# Patient Record
Sex: Male | Born: 1955 | Race: White | Hispanic: No | Marital: Married | Smoking: Never smoker
Health system: Southern US, Community
[De-identification: ages and names within clinical notes are randomized; demographics above are authoritative.]

## PROBLEM LIST (undated history)

## (undated) DIAGNOSIS — I2510 Atherosclerotic heart disease of native coronary artery without angina pectoris: Secondary | ICD-10-CM

## (undated) DIAGNOSIS — I10 Essential (primary) hypertension: Secondary | ICD-10-CM

## (undated) DIAGNOSIS — I4891 Unspecified atrial fibrillation: Secondary | ICD-10-CM

## (undated) HISTORY — PX: FETAL SURGERY FOR CONGENITAL HERNIA: SHX1618

## (undated) HISTORY — DX: Essential (primary) hypertension: I10

## (undated) HISTORY — DX: Atherosclerotic heart disease of native coronary artery without angina pectoris: I25.10

---

## 2008-10-03 ENCOUNTER — Emergency Department (HOSPITAL_COMMUNITY): Admission: EM | Admit: 2008-10-03 | Discharge: 2008-10-03 | Payer: Self-pay | Admitting: Emergency Medicine

## 2010-10-11 ENCOUNTER — Other Ambulatory Visit: Payer: Self-pay | Admitting: Family Medicine

## 2010-10-11 DIAGNOSIS — H539 Unspecified visual disturbance: Secondary | ICD-10-CM

## 2010-12-02 ENCOUNTER — Other Ambulatory Visit: Payer: Self-pay

## 2014-12-23 ENCOUNTER — Other Ambulatory Visit: Payer: Self-pay | Admitting: Family Medicine

## 2014-12-23 DIAGNOSIS — M79601 Pain in right arm: Secondary | ICD-10-CM

## 2015-01-02 ENCOUNTER — Ambulatory Visit
Admission: RE | Admit: 2015-01-02 | Discharge: 2015-01-02 | Disposition: A | Discharge status: Home / Self Care | Payer: BC Managed Care – PPO | Source: Ambulatory Visit | Attending: Family Medicine | Admitting: Family Medicine

## 2015-01-02 DIAGNOSIS — M79601 Pain in right arm: Secondary | ICD-10-CM

## 2016-03-02 ENCOUNTER — Other Ambulatory Visit: Payer: Self-pay | Admitting: Family Medicine

## 2016-03-02 ENCOUNTER — Ambulatory Visit
Admission: RE | Admit: 2016-03-02 | Discharge: 2016-03-02 | Disposition: A | Discharge status: Home / Self Care | Payer: BC Managed Care – PPO | Source: Ambulatory Visit | Attending: Family Medicine | Admitting: Family Medicine

## 2016-03-02 DIAGNOSIS — R109 Unspecified abdominal pain: Secondary | ICD-10-CM

## 2021-07-05 DIAGNOSIS — Z8249 Family history of ischemic heart disease and other diseases of the circulatory system: Secondary | ICD-10-CM | POA: Diagnosis not present

## 2021-07-05 DIAGNOSIS — M1990 Unspecified osteoarthritis, unspecified site: Secondary | ICD-10-CM | POA: Diagnosis not present

## 2021-07-05 DIAGNOSIS — I10 Essential (primary) hypertension: Secondary | ICD-10-CM | POA: Diagnosis not present

## 2021-07-05 DIAGNOSIS — E785 Hyperlipidemia, unspecified: Secondary | ICD-10-CM | POA: Diagnosis not present

## 2021-08-15 DIAGNOSIS — G629 Polyneuropathy, unspecified: Secondary | ICD-10-CM | POA: Diagnosis not present

## 2021-08-15 DIAGNOSIS — I10 Essential (primary) hypertension: Secondary | ICD-10-CM | POA: Diagnosis not present

## 2021-08-15 DIAGNOSIS — R42 Dizziness and giddiness: Secondary | ICD-10-CM | POA: Diagnosis not present

## 2021-09-06 DIAGNOSIS — N23 Unspecified renal colic: Secondary | ICD-10-CM | POA: Diagnosis not present

## 2021-09-11 NOTE — Progress Notes (Signed)
?Cardiology Office Note:   ?Date:  09/13/2021  ?NAME:  Allah Hockenbury    ?MRN: [id number] ?DOB:  1956-02-18  ? ?PCP:  Pcp, No  ?Cardiologist:  None  ?Electrophysiologist:  None  ? ?Referring MD: Sigmund Hazel, MD  ? ?Chief Complaint  ?Patient presents with  ? Hyperlipidemia  ? Dizziness  ? ? ?History of Present Illness:   ?Johnathin Wichmann is a 66 y.o. male with a hx of HLD, HTN who is being seen today for the evaluation of HLD at the request of Sigmund Hazel, MD. He reports he had an episode of dizziness and low blood pressure. He reports he went for a walk with his wife around 11 am that morning. BP was checked and around 90/60. Had no CP or SOB. Reports low energy with the episode. Symptoms resolved and no further symptoms despite some fatigue with carrying his grandchild. Walks 20 miles per week. No CP or SOB. EKG with nsr and no acute changes. CV exam normal. Dad had heart disease which appears to be valve related. No coronary history in family. On a statin and LDL well controlled. Not diabetic. BP has been controlled. He is concerned he had a heart attack. Apparently did not eat breakfast the day in question. Only had 1 glass of water. I suspect this was a vagal event in the setting of dehydration. Recent labs show normal HGB 16.4 and normal TSH.   ? ?Problem List ?HTN ?HLD ?-T chol 98, HDL 32, LDL 53, TG 61 ? ?Past Medical History: ?Past Medical History:  ?Diagnosis Date  ? Hypertension   ? ? ?Past Surgical History: ?Past Surgical History:  ?Procedure Laterality Date  ? FETAL SURGERY FOR CONGENITAL HERNIA    ? ? ?Current Medications: ?Current Meds  ?Medication Sig  ? atorvastatin (LIPITOR) 10 MG tablet 1 tablet  ? B Complex CAPS 1 capsule  ? doxylamine, Sleep, (SLEEP AID) 25 MG tablet 1 tablet at bedtime as needed  ? losartan (COZAAR) 100 MG tablet 1 tablet  ? naproxen sodium (ALEVE) 220 MG tablet 1 tablet with food or milk as needed  ?  ? ?Allergies:    ?Patient has no allergy information on record.  ? ?Social  History: ?Social History  ? ?Socioeconomic History  ? Marital status: Married  ?  Spouse name: Not on file  ? Number of children: 2  ? Years of education: Not on file  ? Highest education level: Not on file  ?Occupational History  ? Occupation: Print production planner  ?Tobacco Use  ? Smoking status: Never  ? Smokeless tobacco: Never  ?Substance and Sexual Activity  ? Alcohol use: Not Currently  ? Drug use: Never  ? Sexual activity: Not on file  ?Other Topics Concern  ? Not on file  ?Social History Narrative  ? Not on file  ? ?Social Determinants of Health  ? ?Financial Resource Strain: Not on file  ?Food Insecurity: Not on file  ?Transportation Needs: Not on file  ?Physical Activity: Not on file  ?Stress: Not on file  ?Social Connections: Not on file  ?  ? ?Family History: ?The patient's family history includes Heart disease in his father. ? ?ROS:   ?All other ROS reviewed and negative. Pertinent positives noted in the HPI.    ? ?EKGs/Labs/Other Studies Reviewed:   ?The following studies were personally reviewed by me today: ? ?EKG:  EKG is ordered today.  The ekg ordered today demonstrates normal sinus rhythm heart rate 61, no acute ischemic changes  or evidence of infarction, and was personally reviewed by me.  ? ?Recent Labs: ?No results found for requested labs within last 8760 hours.  ? ?Recent Lipid Panel ?No results found for: CHOL, TRIG, HDL, CHOLHDL, VLDL, LDLCALC, LDLDIRECT ? ?Physical Exam:   ?VS:  BP 124/78   Pulse 61   Ht 6' (1.829 m)   Wt 212 lb 12.8 oz (96.5 kg)   SpO2 97%   BMI 28.86 kg/m?    ?Wt Readings from Last 3 Encounters:  ?09/13/21 212 lb 12.8 oz (96.5 kg)  ?  ?General: Well nourished, well developed, in no acute distress ?Head: Atraumatic, normal size  ?Eyes: PEERLA, EOMI  ?Neck: Supple, no JVD ?Endocrine: No thryomegaly ?Cardiac: Normal S1, S2; RRR; no murmurs, rubs, or gallops ?Lungs: Clear to auscultation bilaterally, no wheezing, rhonchi or rales  ?Abd: Soft, nontender, no hepatomegaly   ?Ext: No edema, pulses 2+ ?Musculoskeletal: No deformities, BUE and BLE strength normal and equal ?Skin: Warm and dry, no rashes   ?Neuro: Alert and oriented to person, place, time, and situation, CNII-XII grossly intact, no focal deficits  ?Psych: Normal mood and affect  ? ?ASSESSMENT:   ?Merton Borderrthur Schley is a 66 y.o. male who presents for the following: ?1. Mixed hyperlipidemia   ?2. Dizziness   ? ? ?PLAN:   ?1. Mixed hyperlipidemia ?2. Dizziness ?-had a vasovagal episode on 08/06/2021. No further recurrence of symptoms. Recent labs including hgb normal. EKG normal. CV exam normal. Walking 20 miles/week without CP or SOB. I really think this is non-cardiac. I have recommended calcium scoring for further risk stratification. If CAC=0, low risk and no further testing. I really have low suspicion for heart disease. LDL controlled and HTN controlled. We will see back as needed. Recommended to stay hydrated and continue with exercise.  ? ?Disposition: Return if symptoms worsen or fail to improve. ? ?Medication Adjustments/Labs and Tests Ordered: ?Current medicines are reviewed at length with the patient today.  Concerns regarding medicines are outlined above.  ?Orders Placed This Encounter  ?Procedures  ? CT CARDIAC SCORING (SELF PAY ONLY)  ? EKG 12-Lead  ? ?No orders of the defined types were placed in this encounter. ? ? ?Patient Instructions  ?Medication Instructions:  ?The current medical regimen is effective;  continue present plan and medications. ? ?*If you need a refill on your cardiac medications before your next appointment, please call your pharmacy* ? ? ? ?Testing/Procedures: ?CALCIUM SCORE ? ? ?Follow-Up: ?At [facility], you and your health needs are our priority.  As part of our continuing mission to provide you with exceptional heart care, we have created designated Provider Care Teams.  These Care Teams include your primary Cardiologist (physician) and Advanced Practice Providers (APPs -  Physician  Assistants and Nurse Practitioners) who all work together to provide you with the care you need, when you need it. ? ?We recommend signing up for the patient portal called "MyChart".  Sign up information is provided on this After Visit Summary.  MyChart is used to connect with patients for Virtual Visits (Telemedicine).  Patients are able to view lab/test results, encounter notes, upcoming appointments, etc.  Non-urgent messages can be sent to your provider as well.   ?To learn more about what you can do with MyChart, go to [email or web address].   ? ?Your next appointment:   ?As needed ? ?The format for your next appointment:   ?In Person ? ?Provider:   ?Lennie OdorWesley O'Neal, MD  ?  ? ?Signed, ?Gerri SporeWesley T.  Flora Lipps, MD, [facility] ?[place]  [facility]  ?[address], [address] ?[place], [place] [address] ?[phone]  ?09/13/2021 12:31 PM    ? ?

## 2021-09-13 ENCOUNTER — Ambulatory Visit (INDEPENDENT_AMBULATORY_CARE_PROVIDER_SITE_OTHER): Payer: Medicare PPO | Admitting: Cardiovascular Disease

## 2021-09-13 ENCOUNTER — Encounter: Payer: Self-pay | Admitting: Cardiovascular Disease

## 2021-09-13 ENCOUNTER — Other Ambulatory Visit: Payer: Self-pay

## 2021-09-13 VITALS — BP 124/78 | HR 61 | Ht 72.0 in | Wt 212.8 lb

## 2021-09-13 DIAGNOSIS — R42 Dizziness and giddiness: Secondary | ICD-10-CM

## 2021-09-13 DIAGNOSIS — E782 Mixed hyperlipidemia: Secondary | ICD-10-CM | POA: Diagnosis not present

## 2021-09-13 NOTE — Patient Instructions (Signed)
Medication Instructions:  The current medical regimen is effective;  continue present plan and medications.  *If you need a refill on your cardiac medications before your next appointment, please call your pharmacy*   Testing/Procedures: CALCIUM SCORE   Follow-Up: At [facility], you and your health needs are our priority.  As part of our continuing mission to provide you with exceptional heart care, we have created designated Provider Care Teams.  These Care Teams include your primary Cardiologist (physician) and Advanced Practice Providers (APPs -  Physician Assistants and Nurse Practitioners) who all work together to provide you with the care you need, when you need it.  We recommend signing up for the patient portal called "MyChart".  Sign up information is provided on this After Visit Summary.  MyChart is used to connect with patients for Virtual Visits (Telemedicine).  Patients are able to view lab/test results, encounter notes, upcoming appointments, etc.  Non-urgent messages can be sent to your provider as well.   To learn more about what you can do with MyChart, go to [email or web address].    Your next appointment:   As needed  The format for your next appointment:   In Person  Provider:   [place] O'Neal, MD     

## 2021-10-26 ENCOUNTER — Ambulatory Visit (INDEPENDENT_AMBULATORY_CARE_PROVIDER_SITE_OTHER)
Admission: RE | Admit: 2021-10-26 | Discharge: 2021-10-26 | Disposition: A | Discharge status: Home / Self Care | Payer: Self-pay | Source: Ambulatory Visit | Attending: Cardiovascular Disease | Admitting: Cardiovascular Disease

## 2021-10-26 ENCOUNTER — Encounter: Payer: Self-pay | Admitting: Cardiovascular Disease

## 2021-10-26 DIAGNOSIS — E782 Mixed hyperlipidemia: Secondary | ICD-10-CM

## 2021-11-10 NOTE — Progress Notes (Signed)
Cardiology Office Note:   Date:  11/11/2021  NAME:  Jamy Alkhatib    MRN: [id number] DOB:  1956-04-10   PCP:  Sigmund Hazel, MD  Cardiologist:  None  Electrophysiologist:  None   Referring MD: Sigmund Hazel, MD   Chief Complaint  Patient presents with   Follow-up        History of Present Illness:   Dillyn Douthat is a 66 y.o. male with a hx of HTN, HLD, elevated calcium score who presents for follow-up.  No chest pain symptoms.  Here to discuss calcium scoring as well as a sending aortic aneurysm.  Reports no chest pain or trouble breathing.  Can walk 3 to 5 miles 4 to 5 days/week.  Calcium score in the 91st percentile.  Value 992.  We discussed proper diet as well as exercise.  Seems to be doing this well.  His most recent LDL cholesterol is 53 which is at goal.  We discussed proceeding with coronary CTA to exclude high risk disease.  He is okay to do this.  He reports some increased shortness of breath with heavy activity.  We also discussed LP(a).  Really no risk factors for coronary disease.  Would like to make sure he does not have anything that explains this.  He also needs to have a contrast CT which we can do with the coronary CT to look at his ascending aorta.  He is willing to do both.  Problem List HTN HLD -T chol 98, HDL 32, LDL 53, TG 61 3. CAD -calcium score 992 (91st percentile) 4. Ascending aortic aneurysm 43 mm  Past Medical History: Past Medical History:  Diagnosis Date   Hypertension     Past Surgical History: Past Surgical History:  Procedure Laterality Date   FETAL SURGERY FOR CONGENITAL HERNIA      Current Medications: Current Meds  Medication Sig   atorvastatin (LIPITOR) 10 MG tablet 1 tablet   B Complex CAPS 1 capsule   doxylamine, Sleep, (SLEEP AID) 25 MG tablet 1 tablet at bedtime as needed   losartan (COZAAR) 100 MG tablet 1 tablet   LOW-DOSE ASPIRIN PO    metoprolol tartrate (LOPRESSOR) 100 MG tablet Take 1 tablet by mouth once for procedure.    naproxen sodium (ALEVE) 220 MG tablet 1 tablet with food or milk as needed     Allergies:    Patient has no allergy information on record.   Social History: Social History   Socioeconomic History   Marital status: Married    Spouse name: Not on file   Number of children: 2   Years of education: Not on file   Highest education level: Not on file  Occupational History   Occupation: Business analysis  Tobacco Use   Smoking status: Never   Smokeless tobacco: Never  Substance and Sexual Activity   Alcohol use: Not Currently   Drug use: Never   Sexual activity: Not on file  Other Topics Concern   Not on file  Social History Narrative   Not on file   Social Determinants of Health   Financial Resource Strain: Not on file  Food Insecurity: Not on file  Transportation Needs: Not on file  Physical Activity: Not on file  Stress: Not on file  Social Connections: Not on file     Family History: The patient's family history includes Heart disease in his father.  ROS:   All other ROS reviewed and negative. Pertinent positives noted in the HPI.  EKGs/Labs/Other Studies Reviewed:   The following studies were personally reviewed by me today:  CAC 10/26/2021 IMPRESSION: Coronary calcium score of 992 Agatston units. This was 91st percentile for age-, race-, and sex-matched controls.   Mildly dilated ascending aorta, 4.3 cm.  Recent Labs: No results found for requested labs within last 8760 hours.   Recent Lipid Panel No results found for: CHOL, TRIG, HDL, CHOLHDL, VLDL, LDLCALC, LDLDIRECT  Physical Exam:   VS:  BP 136/80   Pulse 96   Ht 6' (1.829 m)   Wt 210 lb 12.8 oz (95.6 kg)   SpO2 96%   BMI 28.59 kg/m    Wt Readings from Last 3 Encounters:  11/11/21 210 lb 12.8 oz (95.6 kg)  09/13/21 212 lb 12.8 oz (96.5 kg)    General: Well nourished, well developed, in no acute distress Head: Atraumatic, normal size  Eyes: PEERLA, EOMI  Neck: Supple, no JVD Endocrine:  No thryomegaly Cardiac: Normal S1, S2; RRR; no murmurs, rubs, or gallops Lungs: Clear to auscultation bilaterally, no wheezing, rhonchi or rales  Abd: Soft, nontender, no hepatomegaly  Ext: No edema, pulses 2+ Musculoskeletal: No deformities, BUE and BLE strength normal and equal Skin: Warm and dry, no rashes   Neuro: Alert and oriented to person, place, time, and situation, CNII-XII grossly intact, no focal deficits  Psych: Normal mood and affect   ASSESSMENT:   Verdie Marland is a 66 y.o. male who presents for the following: 1. Coronary artery disease involving native coronary artery of native heart without angina pectoris   2. SOB (shortness of breath) on exertion   3. Mixed hyperlipidemia   4. Aneurysm of ascending aorta without rupture (HCC)   5. Primary hypertension     PLAN:   1. Coronary artery disease involving native coronary artery of native heart without angina pectoris -Coronary calcium score 992 which is in the 91st percentile.  No chest pain.  EKG normal.  He does report exertional shortness of breath.  This is concerning.  I would like for him to proceed with coronary CTA.  He will take metoprolol tartrate 100 mg 2 hours before the scan.  We will also check a BMP as well as LP(a).  Would like to make sure he has no genetic predisposition to coronary disease.  This will also have implications for family.  He also needs an echocardiogram.  No murmurs on examination.  2. SOB (shortness of breath) on exertion -Exertional shortness of breath.  Occurs with heavy activity.  Would like for him to proceed with coronary CTA as above.  Also needs an echo.  3. Mixed hyperlipidemia -On aspirin 81 mg daily.  On Lipitor.  Most recent LDL 53.  This is at goal.  4. Aneurysm of ascending aorta without rupture (HCC) -43 mm on noncontrast CT.  Contrast CT as above.  Needs echo to look at aortic valve.  5. Primary hypertension -Well-controlled.  No change medication.      Disposition:  Return in about 6 months (around 05/14/2022).  Medication Adjustments/Labs and Tests Ordered: Current medicines are reviewed at length with the patient today.  Concerns regarding medicines are outlined above.  Orders Placed This Encounter  Procedures   CT CORONARY MORPH W/CTA COR W/SCORE W/CA W/CM &/OR WO/CM   Basic metabolic panel   Lipoprotein A (LPA)   ECHOCARDIOGRAM COMPLETE   Meds ordered this encounter  Medications   metoprolol tartrate (LOPRESSOR) 100 MG tablet    Sig: Take 1 tablet by mouth  once for procedure.    Dispense:  1 tablet    Refill:  0    Patient Instructions  Medication Instructions:  Take Metoprolol 100 mg two hours before the CT scan when scheduled.   *If you need a refill on your cardiac medications before your next appointment, please call your pharmacy*   Lab Work: BMET, LPa today   If you have labs (blood work) drawn today and your tests are completely normal, you will receive your results only by: MyChart Message (if you have MyChart) OR A paper copy in the mail If you have any lab test that is abnormal or we need to change your treatment, we will call you to review the results.   Testing/Procedures: Your physician has requested that you have cardiac CT. Cardiac computed tomography (CT) is a painless test that uses an x-ray machine to take clear, detailed pictures of your heart. For further information please visit [email or web address]. Please follow instruction sheet as given.  Echocardiogram - Your physician has requested that you have an echocardiogram. Echocardiography is a painless test that uses sound waves to create images of your heart. It provides your doctor with information about the size and shape of your heart and how well your heart's chambers and valves are working. This procedure takes approximately one hour. There are no restrictions for this procedure.    Follow-Up: At [facility], you and your health needs are our priority.  As  part of our continuing mission to provide you with exceptional heart care, we have created designated Provider Care Teams.  These Care Teams include your primary Cardiologist (physician) and Advanced Practice Providers (APPs -  Physician Assistants and Nurse Practitioners) who all work together to provide you with the care you need, when you need it.  We recommend signing up for the patient portal called "MyChart".  Sign up information is provided on this After Visit Summary.  MyChart is used to connect with patients for Virtual Visits (Telemedicine).  Patients are able to view lab/test results, encounter notes, upcoming appointments, etc.  Non-urgent messages can be sent to your provider as well.   To learn more about what you can do with MyChart, go to [email or web address].    Your next appointment:   6 month(s)  The format for your next appointment:   In Person  Provider:   Lennie OdorWesley O'Neal, MD    Other Instructions   Your cardiac CT will be scheduled at one of the below locations:   [facility] [address] [place], [place] [address] [phone]  If scheduled at [facility], please arrive at the [facility] (Entrance C2) of [facility] 30 minutes prior to test start time. You can use the FREE valet parking offered at entrance C (encouraged to control the heart rate for the test)  Proceed to the [facility] Radiology Department (first floor) to check-in and test prep.  All radiology patients and guests should use entrance C2 at [facility], accessed from [facility], even though the hospital's physical address listed is [address].     Please follow these instructions carefully (unless otherwise directed):  Hold all erectile dysfunction medications at least 3 days (72 hrs) prior to test.  On the Night Before the Test: Be sure to Drink plenty of water. Do not consume any  caffeinated/decaffeinated beverages or chocolate 12 hours prior to your test. Do not take any antihistamines 12 hours prior to your  test.  On the Day of the Test: Drink plenty of water until 1 hour prior to the test. Do not eat any food 4 hours prior to the test. You may take your regular medications prior to the test.  Take metoprolol (Lopressor) two hours prior to test. HOLD Furosemide/Hydrochlorothiazide morning of the test. FEMALES- please wear underwire-free bra if available, avoid dresses & tight clothing       After the Test: Drink plenty of water. After receiving IV contrast, you may experience a mild flushed feeling. This is normal. On occasion, you may experience a mild rash up to 24 hours after the test. This is not dangerous. If this occurs, you can take Benadryl 25 mg and increase your fluid intake. If you experience trouble breathing, this can be serious. If it is severe call 911 IMMEDIATELY. If it is mild, please call our office. If you take any of these medications: Glipizide/Metformin, Avandament, Glucavance, please do not take 48 hours after completing test unless otherwise instructed.  We will call to schedule your test 2-4 weeks out understanding that some insurance companies will need an authorization prior to the service being performed.   For non-scheduling related questions, please contact the cardiac imaging nurse navigator should you have any questions/concerns: Rockwell [place], Cardiac Imaging Nurse Navigator Larey Brick, Cardiac Imaging Nurse Navigator [facility] Direct Office Dial: [phone]   For scheduling needs, including cancellations and rescheduling, please call [place], [phone].           Time Spent with Patient: I have spent a total of 35 minutes with patient reviewing hospital notes, telemetry, EKGs, labs and examining the patient as well as establishing an assessment and plan that was discussed with the  patient.  > 50% of time was spent in direct patient care.  Signed, Lenna Gilford. Flora Lipps, MD, [facility]  [facility]  [address], [address] [place], [place] [address] [phone]  11/11/2021 11:00 AM

## 2021-11-11 ENCOUNTER — Ambulatory Visit: Payer: Medicare PPO | Admitting: Cardiovascular Disease

## 2021-11-11 ENCOUNTER — Encounter: Payer: Self-pay | Admitting: Cardiovascular Disease

## 2021-11-11 VITALS — BP 136/80 | HR 96 | Ht 72.0 in | Wt 210.8 lb

## 2021-11-11 DIAGNOSIS — I2510 Atherosclerotic heart disease of native coronary artery without angina pectoris: Secondary | ICD-10-CM | POA: Diagnosis not present

## 2021-11-11 DIAGNOSIS — I7121 Aneurysm of the ascending aorta, without rupture: Secondary | ICD-10-CM

## 2021-11-11 DIAGNOSIS — R0602 Shortness of breath: Secondary | ICD-10-CM | POA: Diagnosis not present

## 2021-11-11 DIAGNOSIS — I10 Essential (primary) hypertension: Secondary | ICD-10-CM | POA: Diagnosis not present

## 2021-11-11 DIAGNOSIS — E782 Mixed hyperlipidemia: Secondary | ICD-10-CM | POA: Diagnosis not present

## 2021-11-11 MED ORDER — METOPROLOL TARTRATE 100 MG PO TABS: ORAL_TABLET | ORAL | 0 refills | Status: DC

## 2021-11-11 NOTE — Patient Instructions (Signed)
Medication Instructions:  Take Metoprolol 100 mg two hours before the CT scan when scheduled.   *If you need a refill on your cardiac medications before your next appointment, please call your pharmacy*   Lab Work: BMET, LPa today   If you have labs (blood work) drawn today and your tests are completely normal, you will receive your results only by: MyChart Message (if you have MyChart) OR A paper copy in the mail If you have any lab test that is abnormal or we need to change your treatment, we will call you to review the results.   Testing/Procedures: Your physician has requested that you have cardiac CT. Cardiac computed tomography (CT) is a painless test that uses an x-ray machine to take clear, detailed pictures of your heart. For further information please visit [email or web address] Please follow instruction sheet as given.  Echocardiogram - Your physician has requested that you have an echocardiogram. Echocardiography is a painless test that uses sound waves to create images of your heart. It provides your doctor with information about the size and shape of your heart and how well your heart's chambers and valves are working. This procedure takes approximately one hour. There are no restrictions for this procedure.    Follow-Up: At [facility], you and your health needs are our priority.  As part of our continuing mission to provide you with exceptional heart care, we have created designated Provider Care Teams.  These Care Teams include your primary Cardiologist (physician) and Advanced Practice Providers (APPs -  Physician Assistants and Nurse Practitioners) who all work together to provide you with the care you need, when you need it.  We recommend signing up for the patient portal called "MyChart".  Sign up information is provided on this After Visit Summary.  MyChart is used to connect with patients for Virtual Visits (Telemedicine).  Patients are able to view lab/test results,  encounter notes, upcoming appointments, etc.  Non-urgent messages can be sent to your provider as well.   To learn more about what you can do with MyChart, go to [email or web address].    Your next appointment:   6 month(s)  The format for your next appointment:   In Person  Provider:   Lennie Odor, MD    Other Instructions   Your cardiac CT will be scheduled at one of the below locations:   [facility] [address] [place], [place] [address] [phone]  If scheduled at [facility], please arrive at the [facility] Entrance (Entrance C2) of [facility] 30 minutes prior to test start time. You can use the FREE valet parking offered at entrance C (encouraged to control the heart rate for the test)  Proceed to the [facility] Radiology Department (first floor) to check-in and test prep.  All radiology patients and guests should use entrance C2 at [facility], accessed from [facility], even though the hospital's physical address listed is [address].     Please follow these instructions carefully (unless otherwise directed):  Hold all erectile dysfunction medications at least 3 days (72 hrs) prior to test.  On the Night Before the Test: Be sure to Drink plenty of water. Do not consume any caffeinated/decaffeinated beverages or chocolate 12 hours prior to your test. Do not take any antihistamines 12 hours prior to your test.  On the Day of the Test: Drink plenty of water until 1 hour prior to the test. Do not eat any  food 4 hours prior to the test. You may take your regular medications prior to the test.  Take metoprolol (Lopressor) two hours prior to test. HOLD Furosemide/Hydrochlorothiazide morning of the test. FEMALES- please wear underwire-free bra if available, avoid dresses & tight clothing       After the Test: Drink plenty of water. After receiving IV contrast, you may experience a  mild flushed feeling. This is normal. On occasion, you may experience a mild rash up to 24 hours after the test. This is not dangerous. If this occurs, you can take Benadryl 25 mg and increase your fluid intake. If you experience trouble breathing, this can be serious. If it is severe call 911 IMMEDIATELY. If it is mild, please call our office. If you take any of these medications: Glipizide/Metformin, Avandament, Glucavance, please do not take 48 hours after completing test unless otherwise instructed.  We will call to schedule your test 2-4 weeks out understanding that some insurance companies will need an authorization prior to the service being performed.   For non-scheduling related questions, please contact the cardiac imaging nurse navigator should you have any questions/concerns: Rockwell Alexandria, Cardiac Imaging Nurse Navigator Larey Brick, Cardiac Imaging Nurse Navigator [facility] Direct Office Dial: [phone]   For scheduling needs, including cancellations and rescheduling, please call [place], [phone].

## 2021-11-12 LAB — BASIC METABOLIC PANEL
BUN/Creatinine Ratio: 12 (ref 10–24)
BUN: 12 mg/dL (ref 8–27)
CO2: 26 mmol/L (ref 20–29)
Calcium: 9.8 mg/dL (ref 8.6–10.2)
Chloride: 97 mmol/L (ref 96–106)
Creatinine, Ser: 0.97 mg/dL (ref 0.76–1.27)
Glucose: 117 mg/dL — ABNORMAL HIGH (ref 70–99)
Potassium: 4.7 mmol/L (ref 3.5–5.2)
Sodium: 136 mmol/L (ref 134–144)
eGFR: 86 mL/min/{1.73_m2} (ref >59)

## 2021-11-12 LAB — LIPOPROTEIN A (LPA): Lipoprotein (a): 177.8 nmol/L — ABNORMAL HIGH (ref <75.0)

## 2021-11-14 ENCOUNTER — Encounter: Payer: Self-pay | Admitting: Cardiovascular Disease

## 2021-11-30 ENCOUNTER — Telehealth (HOSPITAL_COMMUNITY): Payer: Self-pay | Admitting: *Deleted

## 2021-11-30 NOTE — Telephone Encounter (Signed)
Attempted to call patient regarding upcoming cardiac CT appointment. °Left message on voicemail with name and callback number ° °Rehaan Viloria RN Navigator Cardiac Imaging °[facility] °[phone] Office °[phone] Cell ° °

## 2021-12-01 ENCOUNTER — Ambulatory Visit (HOSPITAL_BASED_OUTPATIENT_CLINIC_OR_DEPARTMENT_OTHER)
Admission: RE | Admit: 2021-12-01 | Discharge: 2021-12-01 | Disposition: A | Discharge status: Home / Self Care | Payer: Medicare PPO | Source: Ambulatory Visit | Attending: Cardiology | Admitting: Cardiology

## 2021-12-01 ENCOUNTER — Ambulatory Visit (HOSPITAL_COMMUNITY)
Admission: RE | Admit: 2021-12-01 | Discharge: 2021-12-01 | Disposition: A | Discharge status: Home / Self Care | Payer: Medicare PPO | Source: Ambulatory Visit | Attending: Cardiovascular Disease | Admitting: Cardiovascular Disease

## 2021-12-01 ENCOUNTER — Ambulatory Visit (HOSPITAL_COMMUNITY)
Admission: RE | Admit: 2021-12-01 | Discharge: 2021-12-01 | Disposition: A | Discharge status: Home / Self Care | Payer: Medicare PPO | Source: Ambulatory Visit | Attending: Cardiology | Admitting: Cardiology

## 2021-12-01 ENCOUNTER — Other Ambulatory Visit: Payer: Self-pay | Admitting: Cardiology

## 2021-12-01 DIAGNOSIS — I2510 Atherosclerotic heart disease of native coronary artery without angina pectoris: Secondary | ICD-10-CM

## 2021-12-01 DIAGNOSIS — R931 Abnormal findings on diagnostic imaging of heart and coronary circulation: Secondary | ICD-10-CM

## 2021-12-01 MED ORDER — NITROGLYCERIN 0.4 MG SL SUBL
SUBLINGUAL_TABLET | SUBLINGUAL | Status: AC
  Filled 2021-12-01: qty 2

## 2021-12-01 MED ORDER — IOHEXOL 350 MG/ML SOLN
100.0000 mL | Freq: Once | INTRAVENOUS | Status: AC | PRN
  Administered 2021-12-01: 100 mL via INTRAVENOUS

## 2021-12-01 MED ORDER — NITROGLYCERIN 0.4 MG SL SUBL
0.8000 mg | SUBLINGUAL_TABLET | Freq: Once | SUBLINGUAL | Status: AC
  Administered 2021-12-01: 0.8 mg via SUBLINGUAL

## 2021-12-02 ENCOUNTER — Telehealth: Payer: Self-pay | Admitting: *Deleted

## 2021-12-02 DIAGNOSIS — I2510 Atherosclerotic heart disease of native coronary artery without angina pectoris: Secondary | ICD-10-CM

## 2021-12-02 MED ORDER — NITROGLYCERIN 0.4 MG SL SUBL: 0.4000 mg | SUBLINGUAL_TABLET | SUBLINGUAL | 3 refills | Status: DC | PRN

## 2021-12-02 NOTE — Telephone Encounter (Signed)
Called Mr. Lovey Newcomer about the results of his CT scan.  No answer.  Left a message for him to call us back.  We will need to get in for DOD appointment next week to discuss left heart catheterization.  Gerri Spore T. Flora Lipps, MD, [facility]  [facility]  [address], [address] [place], [place] [address] [phone]  8:01 AM

## 2021-12-02 NOTE — Telephone Encounter (Signed)
Called Mr. Stagliano.  Finding concerning for severe stenosis in the RCA as well as first diagonal branch.  No symptoms of chest pain.  Walking up to 6 miles per day.  He will see me next week to discussed left heart catheterization.  He will be given a prescription for nitroglycerin.  He was given strict return precautions.  He understands if he has chest discomfort he should take nitroglycerin and that he has persistent symptoms she will take up to 3 nitroglycerin, 3 to 5 minutes per dose before proceeding to the emergency room.  If symptoms do not resolve within 15 minutes he should go to the ER.  Again he has no symptoms.  Should continue aspirin and statin.  We will see him back in the office on Wednesday to discuss left heart cath.  Gerri Spore T. Flora Lipps, MD, [facility]  [facility]  [address], [address] [place], [place] [address] [phone]  9:50 AM

## 2021-12-02 NOTE — Telephone Encounter (Signed)
Follow Up:      Patient is returning your call. 

## 2021-12-04 NOTE — Progress Notes (Unsigned)
Cardiology Office Note:   Date:  12/07/2021  NAME:  Kevin Larsen    MRN: [id number] DOB:  07-28-1955   PCP:  Sigmund Hazel, MD  Cardiologist:  None  Electrophysiologist:  None   Referring MD: Sigmund Hazel, MD   Chief Complaint  Patient presents with   Follow-up         History of Present Illness:   Kevin Larsen is a 66 y.o. male with a hx of CAD who presents for follow-up.  He presents to discuss his coronary CTA.  Concern for severe stenosis in the RCA as well as first diagonal branch.  Positive by FFR.  No symptoms of chest pain.  No shortness of breath.  Echo is normal.  EKG in office is normal.  I am a bit concerned about this.  He does have high risk findings.  I recommended left heart catheterization to sort this out.  If he does have very severe stenoses PCI would be warranted.  This is not for mortality benefit but to reduce any chance of myocardial infarction.  His blood pressure is well controlled.  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  I have recommended screening for his family members.  We also discussed increasing his Lipitor.  His LDL cholesterol is likely not low enough for him.  He is okay to do this.  He will need blood work today.  Risk and benefits of left heart catheterization explained.  He is willing to proceed.  Problem List HTN HLD -T chol 98, HDL 32, LDL 53, TG 61 3. CAD -calcium score 992 (91st percentile) -severe RCA/D1 70-99% -LP(a) 177 4. Ascending aortic aneurysm 43 mm  Past Medical History: Past Medical History:  Diagnosis Date   Coronary artery disease    Hypertension     Past Surgical History: Past Surgical History:  Procedure Laterality Date   FETAL SURGERY FOR CONGENITAL HERNIA      Current Medications: Current Meds  Medication Sig   B Complex CAPS 1 capsule   doxylamine, Sleep, (SLEEP AID) 25 MG tablet 1 tablet at bedtime as needed   losartan (COZAAR) 100 MG tablet 1 tablet   LOW-DOSE ASPIRIN PO    naproxen sodium (ALEVE) 220 MG tablet 1  tablet with food or milk as needed   nitroGLYCERIN (NITROSTAT) 0.4 MG SL tablet Place 1 tablet (0.4 mg total) under the tongue every 5 (five) minutes as needed for chest pain.   [DISCONTINUED] atorvastatin (LIPITOR) 10 MG tablet 1 tablet     Allergies:    Patient has no known allergies.   Social History: Social History   Socioeconomic History   Marital status: Married    Spouse name: Not on file   Number of children: 2   Years of education: Not on file   Highest education level: Not on file  Occupational History   Occupation: Business analysis  Tobacco Use   Smoking status: Never   Smokeless tobacco: Never  Substance and Sexual Activity   Alcohol use: Not Currently   Drug use: Never   Sexual activity: Not on file  Other Topics Concern   Not on file  Social History Narrative   Not on file   Social Determinants of Health   Financial Resource Strain: Not on file  Food Insecurity: Not on file  Transportation Needs: Not on file  Physical Activity: Not on file  Stress: Not on file  Social Connections: Not on file     Family History: The patient's family history  includes Heart disease in his father.  ROS:   All other ROS reviewed and negative. Pertinent positives noted in the HPI.     EKGs/Labs/Other Studies Reviewed:   The following studies were personally reviewed by me today:  EKG:  EKG is ordered today.  The ekg ordered today demonstrates normal sinus rhythm heart rate 60, nonspecific ST-T changes, and was personally reviewed by me.   FFR CT 12/01/2021 1. Left Main: findings 0.99, 0.99   2. LAD: findings 0.97, 0.92 0.83   D1: 0.94, 0.62.   3. LCX: findings 0.97, 0.95 0.92   4. OM: findings 0.94, 0.92   5. RCA: findings 0.96, 0.78 0.72  CCTA 12/01/2020 IMPRESSION: 1. Coronary calcium score of 1067. This was 80 percentile for age-, sex, and race-matched controls.   2. Normal coronary origin with right dominance.   3. Severe CAD with severe (70-99) stenoses  noted in the distal LAD, proximal diagonal and proximal to mid RCA.   4. 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  Recent Labs: 11/11/2021: BUN 12; Creatinine, Ser 0.97; Potassium 4.7; Sodium 136   Recent Lipid Panel No results found for: "CHOL", "TRIG", "HDL", "CHOLHDL", "VLDL", "LDLCALC", "LDLDIRECT"  Physical Exam:   VS:  BP 120/78   Pulse 60   Ht 6' (1.829 m)   Wt 213 lb 6.4 oz (96.8 kg)   SpO2 96%   BMI 28.94 kg/m    Wt Readings from Last 3 Encounters:  12/07/21 213 lb 6.4 oz (96.8 kg)  11/11/21 210 lb 12.8 oz (95.6 kg)  09/13/21 212 lb 12.8 oz (96.5 kg)    General: Well nourished, well developed, in no acute distress Head: Atraumatic, normal size  Eyes: PEERLA, EOMI  Neck: Supple, no JVD Endocrine: No thryomegaly Cardiac: Normal S1, S2; RRR; no murmurs, rubs, or gallops Lungs: Clear to auscultation bilaterally, no wheezing, rhonchi or rales  Abd: Soft, nontender, no hepatomegaly  Ext: No edema, pulses 2+ Musculoskeletal: No deformities, BUE and BLE strength normal and equal Skin: Warm and dry, no rashes   Neuro: Alert and oriented to person, place, time, and situation, CNII-XII grossly intact, no focal deficits  Psych: Normal mood and affect   ASSESSMENT:   Kevin Larsen is a 66 y.o. male who presents for the following: 1. Abnormal findings on diagnostic imaging of heart and coronary circulation   2. Coronary artery disease of native artery of native heart with stable angina pectoris (HCC)   3. Mixed hyperlipidemia   4. Aneurysm of ascending aorta without rupture (HCC)     PLAN:   1. Abnormal findings on diagnostic imaging of heart and coronary circulation 2. Coronary artery disease of native artery of native heart with stable angina pectoris (HCC) 3. Mixed hyperlipidemia -Coronary CTA with FFR positive disease in the RCA and first diagonal branch.  Echo normal.  No symptoms of angina.  On aspirin.  Can get short of breath.  EKG is normal.  Given abnormal findings I  recommended left heart catheterization with possible PCI.  I believe his disease may merit this to reduce his risk of having a myocardial infarction.  Given the positive FFR this is concerning.  We also could treat him medically.  We discussed both options.  The best option decided with both of [place] is left heart catheterization with possible PCI.  He will see me back in a few weeks after the procedure.  We will set him up with Dr. Herbie Baltimore.  Blood work today.  We will also  increase his Lipitor to 20 mg daily.  His LP(a) is elevated.  I recommended screening for his family members.  His LDL cholesterol is 53 but I suspect he needs to be much lower based on the findings of his CTA.  4. Aneurysm of ascending aorta without rupture (HCC) -43 mm.  Yearly surveillance.  Shared Decision Making/Informed Consent The risks [stroke (1 in 1000), death (1 in 1000), kidney failure [usually temporary] (1 in 500), bleeding (1 in 200), allergic reaction [possibly serious] (1 in 200)], benefits (diagnostic support and management of coronary artery disease) and alternatives of a cardiac catheterization were discussed in detail with Kevin Larsen and he is willing to proceed.  Disposition: Return in about 6 weeks (around 01/18/2022).  Medication Adjustments/Labs and Tests Ordered: Current medicines are reviewed at length with the patient today.  Concerns regarding medicines are outlined above.  Orders Placed This Encounter  Procedures   CBC   Basic metabolic panel   EKG 12-Lead   Meds ordered this encounter  Medications   atorvastatin (LIPITOR) 20 MG tablet    Sig: Take 1 tablet (20 mg total) by mouth daily.    Dispense:  90 tablet    Refill:  1    Patient Instructions  Medication Instructions:  Increase Lipitor to 20 mg daily   *If you need a refill on your cardiac medications before your next appointment, please call your pharmacy*   Lab Work: BMET, CBC today   If you have labs (blood work) drawn today and  your tests are completely normal, you will receive your results only by: MyChart Message (if you have MyChart) OR A paper copy in the mail If you have any lab test that is abnormal or we need to change your treatment, we will call you to review the results.   Testing/Procedures:  Your physician has requested that you have a cardiac catheterization. Cardiac catheterization is used to diagnose and/or treat various heart conditions. Doctors may recommend this procedure for a number of different reasons. The most common reason is to evaluate chest pain. Chest pain can be a symptom of coronary artery disease (CAD), and cardiac catheterization can show whether plaque is narrowing or blocking your heart's arteries. This procedure is also used to evaluate the valves, as well as measure the blood flow and oxygen levels in different parts of your heart. For further information please visit [email or web address]. Please follow instruction sheet, as given.    Follow-Up: At [facility], you and your health needs are our priority.  As part of our continuing mission to provide you with exceptional heart care, we have created designated Provider Care Teams.  These Care Teams include your primary Cardiologist (physician) and Advanced Practice Providers (APPs -  Physician Assistants and Nurse Practitioners) who all work together to provide you with the care you need, when you need it.  We recommend signing up for the patient portal called "MyChart".  Sign up information is provided on this After Visit Summary.  MyChart is used to connect with patients for Virtual Visits (Telemedicine).  Patients are able to view lab/test results, encounter notes, upcoming appointments, etc.  Non-urgent messages can be sent to your provider as well.   To learn more about what you can do with MyChart, go to [email or web address].    Your next appointment:   August 1st (Tuesday) at 2:20 PM   The format for your next appointment:    In Person  Provider:   Lennie OdorWesley O'Neal, MD  [facility] CARDIOVASCULAR DIVISION [facility] [address] [place] [address] Dept: [phone] Loc: [phone]  Kevin Larsen  12/07/2021  You are scheduled for a Cardiac Catheterization on Tuesday, June 27 with Dr. Bryan Lemma.  1. Please arrive at the Main Entrance A at [facility]: [address] [place], [place] [address] at 7:00 AM (This time is two hours before your procedure to ensure your preparation). Free valet parking service is available.   Special note: Every effort is made to have your procedure done on time. Please understand that emergencies sometimes delay scheduled procedures.  2. Diet: Do not eat solid foods after midnight.  You may have clear liquids until 5 AM upon the day of the procedure.  3. Labs: You will need to have blood drawn today- CBC, BMET. You do not need to be fasting.  4. Medication instructions in preparation for your procedure:   Contrast Allergy: No  On the morning of your procedure, take Aspirin and any morning medicines NOT listed above.  You may use sips of water.  5. Plan to go home the same day, you will only stay overnight if medically necessary. 6. You MUST have a responsible adult to drive you home. 7. An adult MUST be with you the first 24 hours after you arrive home. 8. Bring a current list of your medications, and the last time and date medication taken. 9. Bring ID and current insurance cards. 10.Please wear clothes that are easy to get on and off and wear slip-on shoes.  Thank you for allowing [place] to care for you!   -- [facility]            Time Spent with Patient: I have spent a total of 35 minutes with patient reviewing hospital notes, telemetry, EKGs, labs and examining the patient as well as establishing an assessment and plan that was discussed with the patient.  >  50% of time was spent in direct patient care.  Signed, Lenna Gilford. Flora Lipps, MD, [facility]  [facility]  [address] [place], [place] [address] [phone]  12/07/2021 11:12 AM

## 2021-12-06 ENCOUNTER — Ambulatory Visit (HOSPITAL_COMMUNITY): Payer: Medicare PPO | Attending: Cardiology

## 2021-12-06 DIAGNOSIS — R0602 Shortness of breath: Secondary | ICD-10-CM

## 2021-12-06 LAB — ECHOCARDIOGRAM COMPLETE
Area-P 1/2: 2.78 cm2
S' Lateral: 3.1 cm

## 2021-12-07 ENCOUNTER — Ambulatory Visit: Payer: Medicare PPO | Admitting: Cardiovascular Disease

## 2021-12-07 ENCOUNTER — Encounter: Payer: Self-pay | Admitting: Cardiovascular Disease

## 2021-12-07 VITALS — BP 120/78 | HR 60 | Ht 72.0 in | Wt 213.4 lb

## 2021-12-07 DIAGNOSIS — I7121 Aneurysm of the ascending aorta, without rupture: Secondary | ICD-10-CM | POA: Diagnosis not present

## 2021-12-07 DIAGNOSIS — E782 Mixed hyperlipidemia: Secondary | ICD-10-CM

## 2021-12-07 DIAGNOSIS — I25118 Atherosclerotic heart disease of native coronary artery with other forms of angina pectoris: Secondary | ICD-10-CM

## 2021-12-07 DIAGNOSIS — R931 Abnormal findings on diagnostic imaging of heart and coronary circulation: Secondary | ICD-10-CM

## 2021-12-07 LAB — CBC
Hematocrit: 43.8 % (ref 37.5–51.0)
Hemoglobin: 15.2 g/dL (ref 13.0–17.7)
MCH: 31 pg (ref 26.6–33.0)
MCHC: 34.7 g/dL (ref 31.5–35.7)
MCV: 89 fL (ref 79–97)
Platelets: 234 10*3/uL (ref 150–450)
RBC: 4.9 x10E6/uL (ref 4.14–5.80)
RDW: 11.9 % (ref 11.6–15.4)
WBC: 4.6 10*3/uL (ref 3.4–10.8)

## 2021-12-07 LAB — BASIC METABOLIC PANEL
BUN/Creatinine Ratio: 13 (ref 10–24)
BUN: 14 mg/dL (ref 8–27)
CO2: 26 mmol/L (ref 20–29)
Calcium: 9.5 mg/dL (ref 8.6–10.2)
Chloride: 101 mmol/L (ref 96–106)
Creatinine, Ser: 1.04 mg/dL (ref 0.76–1.27)
Glucose: 92 mg/dL (ref 70–99)
Potassium: 4.7 mmol/L (ref 3.5–5.2)
Sodium: 138 mmol/L (ref 134–144)
eGFR: 79 mL/min/{1.73_m2} (ref >59)

## 2021-12-07 MED ORDER — ATORVASTATIN CALCIUM 20 MG PO TABS: 20.0000 mg | ORAL_TABLET | Freq: Every day | ORAL | 1 refills | Status: DC

## 2021-12-07 NOTE — Patient Instructions (Addendum)
Medication Instructions:  Increase Lipitor to 20 mg daily   *If you need a refill on your cardiac medications before your next appointment, please call your pharmacy*   Lab Work: BMET, CBC today   If you have labs (blood work) drawn today and your tests are completely normal, you will receive your results only by: MyChart Message (if you have MyChart) OR A paper copy in the mail If you have any lab test that is abnormal or we need to change your treatment, we will call you to review the results.   Testing/Procedures:  Your physician has requested that you have a cardiac catheterization. Cardiac catheterization is used to diagnose and/or treat various heart conditions. Doctors may recommend this procedure for a number of different reasons. The most common reason is to evaluate chest pain. Chest pain can be a symptom of coronary artery disease (CAD), and cardiac catheterization can show whether plaque is narrowing or blocking your heart's arteries. This procedure is also used to evaluate the valves, as well as measure the blood flow and oxygen levels in different parts of your heart. For further information please visit [email or web address] Please follow instruction sheet, as given.    Follow-Up: At [facility], you and your health needs are our priority.  As part of our continuing mission to provide you with exceptional heart care, we have created designated Provider Care Teams.  These Care Teams include your primary Cardiologist (physician) and Advanced Practice Providers (APPs -  Physician Assistants and Nurse Practitioners) who all work together to provide you with the care you need, when you need it.  We recommend signing up for the patient portal called "MyChart".  Sign up information is provided on this After Visit Summary.  MyChart is used to connect with patients for Virtual Visits (Telemedicine).  Patients are able to view lab/test results, encounter notes, upcoming appointments,  etc.  Non-urgent messages can be sent to your provider as well.   To learn more about what you can do with MyChart, go to [email or web address].    Your next appointment:   August 1st (Tuesday) at 2:20 PM   The format for your next appointment:   In Person  Provider:   Lennie Odor, MD     [facility] CARDIOVASCULAR DIVISION [facility] [address] [place] [address] Dept: [phone] Loc: [phone]  Kevin Larsen  12/07/2021  You are scheduled for a Cardiac Catheterization on Tuesday, June 27 with Dr. Bryan Lemma.  1. Please arrive at the Main Entrance A at [facility]: [address] [place], [place] [address] at 7:00 AM (This time is two hours before your procedure to ensure your preparation). Free valet parking service is available.   Special note: Every effort is made to have your procedure done on time. Please understand that emergencies sometimes delay scheduled procedures.  2. Diet: Do not eat solid foods after midnight.  You may have clear liquids until 5 AM upon the day of the procedure.  3. Labs: You will need to have blood drawn today- CBC, BMET. You do not need to be fasting.  4. Medication instructions in preparation for your procedure:   Contrast Allergy: No  On the morning of your procedure, take Aspirin and any morning medicines NOT listed above.  You may use sips of water.  5. Plan to go home the same day, you will only stay overnight if medically necessary. 6. You MUST have a responsible adult to  drive you home. 7. An adult MUST be with you the first 24 hours after you arrive home. 8. Bring a current list of your medications, and the last time and date medication taken. 9. Bring ID and current insurance cards. 10.Please wear clothes that are easy to get on and off and wear slip-on shoes.  Thank you for allowing [place] to care for you!   -- [facility]

## 2021-12-19 ENCOUNTER — Telehealth: Payer: Self-pay | Admitting: *Deleted

## 2021-12-19 NOTE — Telephone Encounter (Signed)
Cardiac Catheterization scheduled at [facility] for: Tuesday December 20, 2021 9 AM Arrival time and place: [facility] Main Entrance A at: 7 AM   Nothing to eat after midnight prior to procedure, clear liquids until 5 AM day of procedure.  Medication instructions: -Usual morning medications can be taken with sips of water including aspirin 81 mg.  Confirmed patient has responsible adult to drive home post procedure and be with patient first 24 hours after arriving home.  Patient reports no new symptoms concerning for COVID-19 in the past 10 days.  Reviewed procedure instructions with patient.

## 2021-12-20 ENCOUNTER — Other Ambulatory Visit: Payer: Self-pay

## 2021-12-20 ENCOUNTER — Ambulatory Visit (HOSPITAL_COMMUNITY)
Admission: RE | Admit: 2021-12-20 | Discharge: 2021-12-20 | Disposition: A | Discharge status: Home / Self Care | Payer: Medicare PPO | Source: Ambulatory Visit | Attending: Cardiology | Admitting: Cardiology

## 2021-12-20 ENCOUNTER — Other Ambulatory Visit (HOSPITAL_COMMUNITY): Payer: Self-pay

## 2021-12-20 ENCOUNTER — Ambulatory Visit (HOSPITAL_COMMUNITY)
Admission: RE | Disposition: A | Discharge status: Home / Self Care | Payer: Self-pay | Source: Ambulatory Visit | Attending: Cardiology

## 2021-12-20 DIAGNOSIS — Z79899 Other long term (current) drug therapy: Secondary | ICD-10-CM | POA: Insufficient documentation

## 2021-12-20 DIAGNOSIS — I25118 Atherosclerotic heart disease of native coronary artery with other forms of angina pectoris: Secondary | ICD-10-CM | POA: Diagnosis not present

## 2021-12-20 DIAGNOSIS — R931 Abnormal findings on diagnostic imaging of heart and coronary circulation: Secondary | ICD-10-CM | POA: Diagnosis not present

## 2021-12-20 DIAGNOSIS — I7121 Aneurysm of the ascending aorta, without rupture: Secondary | ICD-10-CM | POA: Diagnosis not present

## 2021-12-20 DIAGNOSIS — I10 Essential (primary) hypertension: Secondary | ICD-10-CM | POA: Diagnosis not present

## 2021-12-20 DIAGNOSIS — I2584 Coronary atherosclerosis due to calcified coronary lesion: Secondary | ICD-10-CM | POA: Insufficient documentation

## 2021-12-20 DIAGNOSIS — E782 Mixed hyperlipidemia: Secondary | ICD-10-CM | POA: Diagnosis not present

## 2021-12-20 DIAGNOSIS — Z955 Presence of coronary angioplasty implant and graft: Secondary | ICD-10-CM

## 2021-12-20 DIAGNOSIS — I2510 Atherosclerotic heart disease of native coronary artery without angina pectoris: Secondary | ICD-10-CM | POA: Diagnosis not present

## 2021-12-20 HISTORY — PX: INTRAVASCULAR PRESSURE WIRE/FFR STUDY: CATH118243

## 2021-12-20 HISTORY — PX: CORONARY STENT INTERVENTION: CATH118234

## 2021-12-20 HISTORY — PX: LEFT HEART CATH AND CORONARY ANGIOGRAPHY: CATH118249

## 2021-12-20 LAB — POCT ACTIVATED CLOTTING TIME
Activated Clotting Time: 233 seconds
Activated Clotting Time: 293 seconds
Activated Clotting Time: 311 seconds

## 2021-12-20 SURGERY — LEFT HEART CATH AND CORONARY ANGIOGRAPHY
Anesthesia: LOCAL

## 2021-12-20 MED ORDER — SODIUM CHLORIDE 0.9 % WEIGHT BASED INFUSION: 1.0000 mL/kg/h | INTRAVENOUS | Status: DC

## 2021-12-20 MED ORDER — ACETAMINOPHEN 325 MG PO TABS: 650.0000 mg | ORAL_TABLET | ORAL | Status: DC | PRN

## 2021-12-20 MED ORDER — HYDRALAZINE HCL 20 MG/ML IJ SOLN: 10.0000 mg | INTRAMUSCULAR | Status: DC | PRN

## 2021-12-20 MED ORDER — MIDAZOLAM HCL 2 MG/2ML IJ SOLN
INTRAMUSCULAR | Status: AC
  Filled 2021-12-20: qty 2

## 2021-12-20 MED ORDER — VERAPAMIL HCL 2.5 MG/ML IV SOLN
INTRAVENOUS | Status: DC | PRN
  Administered 2021-12-20: 10 mL via INTRA_ARTERIAL

## 2021-12-20 MED ORDER — SODIUM CHLORIDE 0.9% FLUSH: 3.0000 mL | Freq: Two times a day (BID) | INTRAVENOUS | Status: DC

## 2021-12-20 MED ORDER — LIDOCAINE HCL (PF) 1 % IJ SOLN
INTRAMUSCULAR | Status: DC | PRN
  Administered 2021-12-20: 2 mL

## 2021-12-20 MED ORDER — HEPARIN SODIUM (PORCINE) 1000 UNIT/ML IJ SOLN
INTRAMUSCULAR | Status: DC | PRN
  Administered 2021-12-20: 5000 [IU] via INTRAVENOUS
  Administered 2021-12-20: 6000 [IU] via INTRAVENOUS
  Administered 2021-12-20: 4000 [IU] via INTRAVENOUS

## 2021-12-20 MED ORDER — ATORVASTATIN CALCIUM 80 MG PO TABS: 80.0000 mg | ORAL_TABLET | Freq: Every day | ORAL | 3 refills | Status: DC

## 2021-12-20 MED ORDER — CLOPIDOGREL BISULFATE 300 MG PO TABS
ORAL_TABLET | ORAL | Status: DC | PRN
  Administered 2021-12-20: 600 mg via ORAL

## 2021-12-20 MED ORDER — ADENOSINE (DIAGNOSTIC) 140MCG/KG/MIN
INTRAVENOUS | Status: DC | PRN
  Administered 2021-12-20: 140 ug/kg/min via INTRAVENOUS

## 2021-12-20 MED ORDER — CLOPIDOGREL BISULFATE 300 MG PO TABS
ORAL_TABLET | ORAL | Status: AC
  Filled 2021-12-20: qty 2

## 2021-12-20 MED ORDER — ADENOSINE 12 MG/4ML IV SOLN
INTRAVENOUS | Status: AC
  Filled 2021-12-20: qty 16

## 2021-12-20 MED ORDER — MORPHINE SULFATE (PF) 2 MG/ML IV SOLN: 2.0000 mg | INTRAVENOUS | Status: DC | PRN

## 2021-12-20 MED ORDER — HEPARIN (PORCINE) IN NACL 1000-0.9 UT/500ML-% IV SOLN
INTRAVENOUS | Status: AC
  Filled 2021-12-20: qty 1000

## 2021-12-20 MED ORDER — SODIUM CHLORIDE 0.9 % WEIGHT BASED INFUSION
3.0000 mL/kg/h | INTRAVENOUS | Status: AC
  Administered 2021-12-20: 3 mL/kg/h via INTRAVENOUS

## 2021-12-20 MED ORDER — ASPIRIN 81 MG PO CHEW: 81.0000 mg | CHEWABLE_TABLET | ORAL | Status: DC

## 2021-12-20 MED ORDER — ONDANSETRON HCL 4 MG/2ML IJ SOLN: 4.0000 mg | Freq: Four times a day (QID) | INTRAMUSCULAR | Status: DC | PRN

## 2021-12-20 MED ORDER — FENTANYL CITRATE (PF) 100 MCG/2ML IJ SOLN
INTRAMUSCULAR | Status: DC | PRN
  Administered 2021-12-20 (×2): 25 ug via INTRAVENOUS

## 2021-12-20 MED ORDER — NITROGLYCERIN 1 MG/10 ML FOR IR/CATH LAB
INTRA_ARTERIAL | Status: AC
  Filled 2021-12-20: qty 10

## 2021-12-20 MED ORDER — LABETALOL HCL 5 MG/ML IV SOLN: 10.0000 mg | INTRAVENOUS | Status: DC | PRN

## 2021-12-20 MED ORDER — SODIUM CHLORIDE 0.9 % IV SOLN: 250.0000 mL | INTRAVENOUS | Status: DC | PRN

## 2021-12-20 MED ORDER — LIDOCAINE HCL (PF) 1 % IJ SOLN
INTRAMUSCULAR | Status: AC
  Filled 2021-12-20: qty 30

## 2021-12-20 MED ORDER — HEPARIN (PORCINE) IN NACL 1000-0.9 UT/500ML-% IV SOLN
INTRAVENOUS | Status: DC | PRN
  Administered 2021-12-20 (×3): 500 mL

## 2021-12-20 MED ORDER — SODIUM CHLORIDE 0.9% FLUSH
3.0000 mL | INTRAVENOUS | Status: DC | PRN
Start: 2021-12-20 — End: 2021-12-20

## 2021-12-20 MED ORDER — SODIUM CHLORIDE 0.9% FLUSH: 3.0000 mL | INTRAVENOUS | Status: DC | PRN

## 2021-12-20 MED ORDER — MIDAZOLAM HCL 2 MG/2ML IJ SOLN
INTRAMUSCULAR | Status: DC | PRN
  Administered 2021-12-20: 2 mg via INTRAVENOUS
  Administered 2021-12-20: 1 mg via INTRAVENOUS

## 2021-12-20 MED ORDER — HEPARIN SODIUM (PORCINE) 1000 UNIT/ML IJ SOLN
INTRAMUSCULAR | Status: AC
  Filled 2021-12-20: qty 10

## 2021-12-20 MED ORDER — CLOPIDOGREL BISULFATE 75 MG PO TABS: 75.0000 mg | ORAL_TABLET | Freq: Every day | ORAL | Status: DC

## 2021-12-20 MED ORDER — CLOPIDOGREL BISULFATE 75 MG PO TABS
75.0000 mg | ORAL_TABLET | Freq: Every day | ORAL | 2 refills | Status: DC
  Filled 2021-12-20 – 2022-02-16 (×2): qty 90, 90d supply, fill #0

## 2021-12-20 MED ORDER — IOHEXOL 350 MG/ML SOLN
INTRAVENOUS | Status: DC | PRN
  Administered 2021-12-20: 170 mL

## 2021-12-20 MED ORDER — VERAPAMIL HCL 2.5 MG/ML IV SOLN
INTRAVENOUS | Status: AC
  Filled 2021-12-20: qty 2

## 2021-12-20 MED ORDER — FENTANYL CITRATE (PF) 100 MCG/2ML IJ SOLN
INTRAMUSCULAR | Status: AC
  Filled 2021-12-20: qty 2

## 2021-12-20 SURGICAL SUPPLY — 27 items
BALL SAPPHIRE NC24 3.75X18 (BALLOONS) ×2
BALLN SAPPHIRE 2.5X15 (BALLOONS) ×2
BALLOON SAPPHIRE 2.5X15 (BALLOONS) IMPLANT
BALLOON SAPPHIRE NC24 3.75X18 (BALLOONS) IMPLANT
BAND CMPR LRG ZPHR (HEMOSTASIS) ×1
BAND ZEPHYR COMPRESS 30 LONG (HEMOSTASIS) ×1 IMPLANT
CATH LAUNCHER 6FR JR4 (CATHETERS) ×1 IMPLANT
CATH OPTITORQUE TIG 4.0 5F (CATHETERS) ×1 IMPLANT
CATH VISTA GUIDE 6FR XBLAD3.5 (CATHETERS) ×1 IMPLANT
GLIDESHEATH SLEND SS 6F .021 (SHEATH) ×1 IMPLANT
GUIDEWIRE INQWIRE 1.5J.035X260 (WIRE) IMPLANT
GUIDEWIRE PRESSURE X 175 (WIRE) ×1 IMPLANT
INQWIRE 1.5J .035X260CM (WIRE) ×2
KIT ENCORE 26 ADVANTAGE (KITS) ×1 IMPLANT
KIT ESSENTIALS PG (KITS) ×1 IMPLANT
KIT HEART LEFT (KITS) ×2 IMPLANT
PACK CARDIAC CATHETERIZATION (CUSTOM PROCEDURE TRAY) ×2 IMPLANT
SHEATH PROBE COVER 6X72 (BAG) ×1 IMPLANT
STENT SYNERGY XD 3.50X24 (Permanent Stent) IMPLANT
STENT SYNERGY XD 3.50X32 (Permanent Stent) IMPLANT
STENT SYNERGY XD 3.50X38 (Permanent Stent) IMPLANT
SYNERGY XD 3.50X24 (Permanent Stent) ×2 IMPLANT
SYNERGY XD 3.50X32 (Permanent Stent) ×2 IMPLANT
SYNERGY XD 3.50X38 (Permanent Stent) ×2 IMPLANT
TRANSDUCER W/STOPCOCK (MISCELLANEOUS) ×2 IMPLANT
TUBING CIL FLEX 10 FLL-RA (TUBING) ×2 IMPLANT
WIRE RUNTHROUGH .014X180CM (WIRE) ×1 IMPLANT

## 2021-12-20 NOTE — Progress Notes (Signed)
Patient and wife was given discharge instructions. He verbalized understanding.

## 2021-12-21 ENCOUNTER — Encounter (HOSPITAL_COMMUNITY): Payer: Self-pay | Admitting: Cardiology

## 2021-12-21 MED FILL — Nitroglycerin IV Soln 100 MCG/ML in D5W: INTRA_ARTERIAL | Qty: 10 | Status: AC

## 2021-12-28 NOTE — Progress Notes (Signed)
Cardiology Clinic Note   Patient Name: Kevin Larsen Date of Encounter: 12/30/2021  Primary Care Provider:  Sigmund Hazel, MD Primary Cardiologist:  Reatha Harps, MD  Patient Profile    66 y.o. male with a hx of CAD with history of CAD, s/p abnormal CTA with calcium score of 1067, leading to cardiac cath 12/20/2021. Cath revealed severe 2 vessel disease of the RCA, LAD. He is s/p extensive PCI of proximal to distal RCA with 3 overlapping stents .   Plan for medical management of the LAD 60% LAD with 95% ostial D1 (followed by 50%) with tandem 50+ percent stenoses in the mid and distal vessel. This was because  RFR was positive when accounting for the second distal lesion 0.86 RFR.  Relatively normal nondominant LCx that courses the large marginal with 3 branches was noted.  Repeat cath with intervention would be considered if symptoms persisted, Other history of HTN, HL(LPa 177), AAA (43 mm).   Past Medical History    Past Medical History:  Diagnosis Date   Coronary artery disease    Hypertension    Past Surgical History:  Procedure Laterality Date   CORONARY STENT INTERVENTION N/A 12/20/2021   Procedure: CORONARY STENT INTERVENTION;  Surgeon: Marykay Lex, MD;  Location: [facility] INVASIVE CV LAB;  Service: Cardiovascular;  Laterality: N/A;   FETAL SURGERY FOR CONGENITAL HERNIA     INTRAVASCULAR PRESSURE WIRE/FFR STUDY N/A 12/20/2021   Procedure: INTRAVASCULAR PRESSURE WIRE/FFR STUDY;  Surgeon: Marykay Lex, MD;  Location: [facility] INVASIVE CV LAB;  Service: Cardiovascular;  Laterality: N/A;   LEFT HEART CATH AND CORONARY ANGIOGRAPHY N/A 12/20/2021   Procedure: LEFT HEART CATH AND CORONARY ANGIOGRAPHY;  Surgeon: Marykay Lex, MD;  Location: [facility] INVASIVE CV LAB;  Service: Cardiovascular;  Laterality: N/A;    Allergies  No Known Allergies  History of Present Illness    Kevin Larsen comes today post hospitalization after having abnormal coronary CTA leading to cardiac cath on 12/20/2021  with Dr. Herbie Larsen. He received extensive intervention of the RCA with 3 DES. Now on DAPT with ASA and Plavix.  He comes today with multiple questions concerning his coronary artery disease, activity, and medications.  He offers no symptoms of chest discomfort , dizziness , fatigue, or dyspnea.  He is medically compliant.  He denies any excessive bleeding or bruising on DAPT.  Home Medications    Current Outpatient Medications  Medication Sig Dispense Refill   acetaminophen (TYLENOL) 500 MG tablet Take 500 mg by mouth every 6 (six) hours as needed.     aspirin EC 81 MG tablet Take 81 mg by mouth daily. Swallow whole.     atorvastatin (LIPITOR) 80 MG tablet Take 1 tablet (80 mg total) by mouth daily. 90 tablet 3   clopidogrel (PLAVIX) 75 MG tablet Take 1 tablet (75 mg total) by mouth daily with breakfast. 90 tablet 2   doxylamine, Sleep, (SLEEP AID) 25 MG tablet Take 25 mg by mouth at bedtime as needed for sleep. [place]     losartan (COZAAR) 100 MG tablet Take 100 mg by mouth daily.     naproxen sodium (ALEVE) 220 MG tablet Take 220 mg by mouth daily as needed (pain).     nitroGLYCERIN (NITROSTAT) 0.4 MG SL tablet Place 1 tablet (0.4 mg total) under the tongue every 5 (five) minutes as needed for chest pain. 90 tablet 3   SUPER B COMPLEX/C PO Take 1 tablet by mouth every morning.     No  current facility-administered medications for this visit.     Family History    Family History  Problem Relation Age of Onset   Heart disease Father    He indicated that his father is deceased.  Social History    Social History   Socioeconomic History   Marital status: Married    Spouse name: Not on file   Number of children: 2   Years of education: Not on file   Highest education level: Not on file  Occupational History   Occupation: Business analysis  Tobacco Use   Smoking status: Never   Smokeless tobacco: Never  Substance and Sexual Activity   Alcohol use: Not Currently   Drug use:  Never   Sexual activity: Not on file  Other Topics Concern   Not on file  Social History Narrative   Not on file   Social Determinants of Health   Financial Resource Strain: Not on file  Food Insecurity: Not on file  Transportation Needs: Not on file  Physical Activity: Not on file  Stress: Not on file  Social Connections: Not on file  Intimate Partner Violence: Not on file     Review of Systems    General:  No chills, fever, night sweats or weight changes.  Cardiovascular:  No chest pain, dyspnea on exertion, edema, orthopnea, palpitations, paroxysmal nocturnal dyspnea. Dermatological: No rash, lesions/masses Respiratory: No cough, dyspnea Urologic: No hematuria, dysuria Abdominal:   No nausea, vomiting, diarrhea, bright red blood per rectum, melena, or hematemesis Neurologic:  No visual changes, wkns, changes in mental status. All other systems reviewed and are otherwise negative except as noted above.     Physical Exam    VS:  BP 130/72   Pulse 67   Ht 6' (1.829 m)   Wt 212 lb 9.6 oz (96.4 kg)   SpO2 98%   BMI 28.83 kg/m  , BMI Body mass index is 28.83 kg/m.     GEN: Well nourished, well developed, in no acute distress. HEENT: normal. Neck: Supple, no JVD, carotid bruits, or masses. Cardiac: RRR, no murmurs, rubs, or gallops. No clubbing, cyanosis, edema.  Radials/DP/PT 2+ and equal bilaterally.  Respiratory:  Respirations regular and unlabored, clear to auscultation bilaterally. GI: Soft, nontender, nondistended, BS + x 4. MS: no deformity or atrophy.  Right wrist catheter insertion site well-healed without evidence of bleeding or hematoma Skin: warm and dry, no rash. Neuro:  Strength and sensation are intact. Psych: Normal affect.  Accessory Clinical Findings    ECG personally reviewed by me today-normal sinus rhythm, heart rate of 67 bpm- No acute changes  Lab Results  Component Value Date   WBC 4.6 12/07/2021   HGB 15.2 12/07/2021   HCT 43.8  12/07/2021   MCV 89 12/07/2021   PLT 234 12/07/2021   Lab Results  Component Value Date   CREATININE 1.04 12/07/2021   BUN 14 12/07/2021   NA 138 12/07/2021   K 4.7 12/07/2021   CL 101 12/07/2021   CO2 26 12/07/2021   No results found for: "ALT", "AST", "GGT", "ALKPHOS", "BILITOT" No results found for: "CHOL", "HDL", "LDLCALC", "LDLDIRECT", "TRIG", "CHOLHDL"  No results found for: "HGBA1C"  Review of Prior Studies: Cardiac Catheterization 01-15-2022:   SUMMARY Severe 2-three-vessel disease: Diffuse RCA disease: 50% heavily calcified proximal followed by tandem 80, 70 then 99% stenoses in the mid segment, then 65-70% distal (positive by FFRCT) Successful extensive PCI of proximal to distal RCA with 3 overlapping DES stents: 3.5 mm  x 38 mm, 3.5 mm 32 mm, 3.5 mm x 24 mm-the entire segment was postdilated to 3.8 mm.   All lesions covered-and reduced to 0%.   TIMI-3 flow pre and post Large caliber LAD with bifurcation 60% LAD with 95% ostial D1 (followed by 50%) with tandem 50+ percent stenoses in the mid and distal vessel.   RFR/FFR including proximal 60% and mid 50% lesions was non--obstructive when discounting the more distal lesions (0.95, 0.83).   However RFR was positive when accounting for the second distal lesion 0.86 RFR.  Relatively normal nondominant LCx that courses of the large marginal with 3 branches.-This is consistent with findings on Coronary CTA FFRCT. => Plan for medical management of diagonal disease If he were to have symptoms, could consider bifurcation PCI of the LAD diagonal Moderately elevated LVEDP of 20 mmHg.    Diagnostic Dominance: Right  Intervention    _____________ RECOMMENDATIONS Aggressive risk factor modification per Dr. Flora Lipps Given the extensive overlapping stents in the RCA and bifurcation LAD disease with severe diagonal disease, would recommend long-term Thienopyridine following uninterrupted DAPT (ASA/Plavix) for 6 months We will defer  management of lipids, etc. to Dr. Flora Lipps  Assessment & Plan   1.  Coronary artery disease: Status post cardiac catheterization revealing multiple lesions within the right coronary artery, status post overlapping drug-eluting stents x3 to the RCA.  He also has a diagonal 1 lesion of 90% which we are treating medically unless he becomes symptomatic.  I have reviewed his cardiac catheterization illustration, discussed discussed the coronary anatomy with location of his stents and provided him a copy.  Multiple questions have been answered.  We will continue DAPT, secondary prevention with statin and blood pressure control.  2.  Mixed hyperlipidemia: Remains on atorvastatin 80 mg daily.  Will need repeat lipids and LFTs in 3 months prior to follow-up visit with Dr. Flora Lipps.  Mediterranean diet is recommended.      Current medicines are reviewed at length with the patient today.  I have spent 45 min's  dedicated to the care of this patient on the date of this encounter to include pre-visit review of records, assessment, management and diagnostic testing,with shared decision making.   Signed, Bettey Mare. Liborio Nixon, ANP, AACC   12/30/2021 12:06 PM    [facility] [address] Office [phone] Fax [phone]  Notice: This dictation was prepared with Dragon dictation along with smaller phrase technology. Any transcriptional errors that result from this process are unintentional and may not be corrected upon review.

## 2021-12-30 ENCOUNTER — Encounter: Payer: Self-pay | Admitting: Adult Health

## 2021-12-30 ENCOUNTER — Ambulatory Visit: Payer: Medicare PPO | Admitting: Adult Health

## 2021-12-30 VITALS — BP 130/72 | HR 67 | Ht 72.0 in | Wt 212.6 lb

## 2021-12-30 DIAGNOSIS — I25118 Atherosclerotic heart disease of native coronary artery with other forms of angina pectoris: Secondary | ICD-10-CM

## 2021-12-30 DIAGNOSIS — E782 Mixed hyperlipidemia: Secondary | ICD-10-CM

## 2021-12-30 NOTE — Patient Instructions (Signed)
Medication Instructions:  No Changes *If you need a refill on your cardiac medications before your next appointment, please call your pharmacy*   Lab Work: BMET,CBC,Lipid Panel. 1 week Prior to follow up visit. If you have labs (blood work) drawn today and your tests are completely normal, you will receive your results only by: MyChart Message (if you have MyChart) OR A paper copy in the mail If you have any lab test that is abnormal or we need to change your treatment, we will call you to review the results.   Testing/Procedures: No Testing   Follow-Up: At [facility], you and your health needs are our priority.  As part of our continuing mission to provide you with exceptional heart care, we have created designated Provider Care Teams.  These Care Teams include your primary Cardiologist (physician) and Advanced Practice Providers (APPs -  Physician Assistants and Nurse Practitioners) who all work together to provide you with the care you need, when you need it.  We recommend signing up for the patient portal called "MyChart".  Sign up information is provided on this After Visit Summary.  MyChart is used to connect with patients for Virtual Visits (Telemedicine).  Patients are able to view lab/test results, encounter notes, upcoming appointments, etc.  Non-urgent messages can be sent to your provider as well.   To learn more about what you can do with MyChart, go to [email or web address].    Your next appointment:   Keep Scheduled Appointment  The format for your next appointment:   In Person  Provider:   Reatha Harps, MD       Important Information About Sugar

## 2022-01-02 DIAGNOSIS — Z125 Encounter for screening for malignant neoplasm of prostate: Secondary | ICD-10-CM | POA: Diagnosis not present

## 2022-01-02 DIAGNOSIS — E7800 Pure hypercholesterolemia, unspecified: Secondary | ICD-10-CM | POA: Diagnosis not present

## 2022-01-02 DIAGNOSIS — I10 Essential (primary) hypertension: Secondary | ICD-10-CM | POA: Diagnosis not present

## 2022-01-02 DIAGNOSIS — Z86010 Personal history of colonic polyps: Secondary | ICD-10-CM | POA: Diagnosis not present

## 2022-01-02 DIAGNOSIS — Z0000 Encounter for general adult medical examination without abnormal findings: Secondary | ICD-10-CM | POA: Diagnosis not present

## 2022-01-02 DIAGNOSIS — Z6829 Body mass index (BMI) 29.0-29.9, adult: Secondary | ICD-10-CM | POA: Diagnosis not present

## 2022-01-02 DIAGNOSIS — I2510 Atherosclerotic heart disease of native coronary artery without angina pectoris: Secondary | ICD-10-CM | POA: Diagnosis not present

## 2022-01-02 DIAGNOSIS — R7303 Prediabetes: Secondary | ICD-10-CM | POA: Diagnosis not present

## 2022-01-02 DIAGNOSIS — Z95820 Peripheral vascular angioplasty status with implants and grafts: Secondary | ICD-10-CM | POA: Diagnosis not present

## 2022-01-02 DIAGNOSIS — G629 Polyneuropathy, unspecified: Secondary | ICD-10-CM | POA: Diagnosis not present

## 2022-01-06 ENCOUNTER — Telehealth (HOSPITAL_COMMUNITY): Payer: Self-pay | Admitting: *Deleted

## 2022-01-06 NOTE — Telephone Encounter (Signed)
Received message on departmental voicemail regarding an email this pt received for him to get in contact with Dr. Herbie Baltimore but he sees Dr. Carmon Ginsberg.  Called and left message that he may have meant to contact his cardiologist office and not cardiac rehab.  Provided contact information for [facility].  Alanson Aly, BSN Cardiac and Emergency planning/management officer

## 2022-01-17 ENCOUNTER — Other Ambulatory Visit (HOSPITAL_COMMUNITY): Payer: Self-pay

## 2022-01-24 ENCOUNTER — Ambulatory Visit: Payer: Medicare PPO | Admitting: Cardiovascular Disease

## 2022-02-14 ENCOUNTER — Emergency Department (HOSPITAL_COMMUNITY): Payer: Medicare PPO

## 2022-02-14 ENCOUNTER — Other Ambulatory Visit: Payer: Self-pay

## 2022-02-14 ENCOUNTER — Inpatient Hospital Stay (HOSPITAL_COMMUNITY)
Admission: EM | Admit: 2022-02-14 | Discharge: 2022-02-16 | DRG: 310 | Disposition: A | Discharge status: Home / Self Care | Payer: Medicare PPO | Attending: Internal Medicine | Admitting: Internal Medicine

## 2022-02-14 DIAGNOSIS — Z8249 Family history of ischemic heart disease and other diseases of the circulatory system: Secondary | ICD-10-CM

## 2022-02-14 DIAGNOSIS — I340 Nonrheumatic mitral (valve) insufficiency: Secondary | ICD-10-CM | POA: Diagnosis not present

## 2022-02-14 DIAGNOSIS — I714 Abdominal aortic aneurysm, without rupture, unspecified: Secondary | ICD-10-CM | POA: Diagnosis not present

## 2022-02-14 DIAGNOSIS — Z955 Presence of coronary angioplasty implant and graft: Secondary | ICD-10-CM | POA: Diagnosis not present

## 2022-02-14 DIAGNOSIS — Z7902 Long term (current) use of antithrombotics/antiplatelets: Secondary | ICD-10-CM

## 2022-02-14 DIAGNOSIS — R000 Tachycardia, unspecified: Secondary | ICD-10-CM | POA: Diagnosis present

## 2022-02-14 DIAGNOSIS — Z7982 Long term (current) use of aspirin: Secondary | ICD-10-CM | POA: Diagnosis not present

## 2022-02-14 DIAGNOSIS — R55 Syncope and collapse: Secondary | ICD-10-CM | POA: Diagnosis not present

## 2022-02-14 DIAGNOSIS — I959 Hypotension, unspecified: Secondary | ICD-10-CM | POA: Diagnosis not present

## 2022-02-14 DIAGNOSIS — I4891 Unspecified atrial fibrillation: Secondary | ICD-10-CM | POA: Diagnosis not present

## 2022-02-14 DIAGNOSIS — I2510 Atherosclerotic heart disease of native coronary artery without angina pectoris: Secondary | ICD-10-CM | POA: Diagnosis present

## 2022-02-14 DIAGNOSIS — I4819 Other persistent atrial fibrillation: Secondary | ICD-10-CM | POA: Diagnosis not present

## 2022-02-14 DIAGNOSIS — I10 Essential (primary) hypertension: Secondary | ICD-10-CM | POA: Diagnosis not present

## 2022-02-14 DIAGNOSIS — I499 Cardiac arrhythmia, unspecified: Secondary | ICD-10-CM | POA: Diagnosis not present

## 2022-02-14 DIAGNOSIS — R0902 Hypoxemia: Secondary | ICD-10-CM | POA: Diagnosis not present

## 2022-02-14 DIAGNOSIS — R231 Pallor: Secondary | ICD-10-CM | POA: Diagnosis not present

## 2022-02-14 DIAGNOSIS — E785 Hyperlipidemia, unspecified: Secondary | ICD-10-CM | POA: Diagnosis present

## 2022-02-14 DIAGNOSIS — Z79899 Other long term (current) drug therapy: Secondary | ICD-10-CM

## 2022-02-14 DIAGNOSIS — R002 Palpitations: Secondary | ICD-10-CM | POA: Diagnosis present

## 2022-02-14 DIAGNOSIS — R0602 Shortness of breath: Secondary | ICD-10-CM | POA: Diagnosis not present

## 2022-02-14 LAB — CBC WITH DIFFERENTIAL/PLATELET
Abs Immature Granulocytes: 0.02 10*3/uL (ref 0.00–0.07)
Basophils Absolute: 0.1 10*3/uL (ref 0.0–0.1)
Basophils Relative: 1 %
Eosinophils Absolute: 0.1 10*3/uL (ref 0.0–0.5)
Eosinophils Relative: 2 %
HCT: 43.1 % (ref 39.0–52.0)
Hemoglobin: 14.8 g/dL (ref 13.0–17.0)
Immature Granulocytes: 0 %
Lymphocytes Relative: 11 %
Lymphs Abs: 0.9 10*3/uL (ref 0.7–4.0)
MCH: 30.5 pg (ref 26.0–34.0)
MCHC: 34.3 g/dL (ref 30.0–36.0)
MCV: 88.9 fL (ref 80.0–100.0)
Monocytes Absolute: 0.7 10*3/uL (ref 0.1–1.0)
Monocytes Relative: 9 %
Neutro Abs: 6.2 10*3/uL (ref 1.7–7.7)
Neutrophils Relative %: 77 %
Platelets: 223 10*3/uL (ref 150–400)
RBC: 4.85 MIL/uL (ref 4.22–5.81)
RDW: 11.9 % (ref 11.5–15.5)
WBC: 8 10*3/uL (ref 4.0–10.5)
nRBC: 0 % (ref 0.0–0.2)

## 2022-02-14 LAB — COMPREHENSIVE METABOLIC PANEL
ALT: 26 U/L (ref 0–44)
AST: 22 U/L (ref 15–41)
Albumin: 3.9 g/dL (ref 3.5–5.0)
Alkaline Phosphatase: 60 U/L (ref 38–126)
Anion gap: 10 (ref 5–15)
BUN: 14 mg/dL (ref 8–23)
CO2: 21 mmol/L — ABNORMAL LOW (ref 22–32)
Calcium: 8.7 mg/dL — ABNORMAL LOW (ref 8.9–10.3)
Chloride: 109 mmol/L (ref 98–111)
Creatinine, Ser: 1.17 mg/dL (ref 0.61–1.24)
GFR, Estimated: >60 mL/min (ref >60)
Glucose, Bld: 98 mg/dL (ref 70–99)
Potassium: 3.6 mmol/L (ref 3.5–5.1)
Sodium: 140 mmol/L (ref 135–145)
Total Bilirubin: 1.2 mg/dL (ref 0.3–1.2)
Total Protein: 6.1 g/dL — ABNORMAL LOW (ref 6.5–8.1)

## 2022-02-14 LAB — TROPONIN I (HIGH SENSITIVITY)
Troponin I (High Sensitivity): 6 ng/L (ref <18)
Troponin I (High Sensitivity): 17 ng/L (ref <18)

## 2022-02-14 LAB — MAGNESIUM: Magnesium: 1.9 mg/dL (ref 1.7–2.4)

## 2022-02-14 LAB — TSH: TSH: 1.863 u[IU]/mL (ref 0.350–4.500)

## 2022-02-14 MED ORDER — DILTIAZEM HCL 25 MG/5ML IV SOLN
10.0000 mg | Freq: Once | INTRAVENOUS | Status: AC
  Administered 2022-02-14: 10 mg via INTRAVENOUS
  Filled 2022-02-14: qty 5

## 2022-02-14 MED ORDER — AMIODARONE HCL IN DEXTROSE 360-4.14 MG/200ML-% IV SOLN
30.0000 mg/h | INTRAVENOUS | Status: DC
  Administered 2022-02-15 – 2022-02-16 (×3): 30 mg/h via INTRAVENOUS
  Filled 2022-02-14 (×4): qty 200

## 2022-02-14 MED ORDER — METOPROLOL TARTRATE 25 MG PO TABS: 12.5000 mg | ORAL_TABLET | Freq: Two times a day (BID) | ORAL | Status: DC

## 2022-02-14 MED ORDER — SODIUM CHLORIDE 0.9 % IV BOLUS (SEPSIS)
1000.0000 mL | Freq: Once | INTRAVENOUS | Status: AC
  Administered 2022-02-14: 1000 mL via INTRAVENOUS

## 2022-02-14 MED ORDER — ATORVASTATIN CALCIUM 80 MG PO TABS
80.0000 mg | ORAL_TABLET | Freq: Every day | ORAL | Status: DC
  Administered 2022-02-15 – 2022-02-16 (×2): 80 mg via ORAL
  Filled 2022-02-14: qty 1
  Filled 2022-02-14: qty 2
  Filled 2022-02-14 (×2): qty 1

## 2022-02-14 MED ORDER — ONDANSETRON HCL 4 MG/2ML IJ SOLN: 4.0000 mg | Freq: Four times a day (QID) | INTRAMUSCULAR | Status: DC | PRN

## 2022-02-14 MED ORDER — ASPIRIN 81 MG PO CHEW
81.0000 mg | CHEWABLE_TABLET | Freq: Every day | ORAL | Status: DC
  Administered 2022-02-15: 81 mg via ORAL
  Filled 2022-02-14 (×2): qty 1

## 2022-02-14 MED ORDER — AMIODARONE LOAD VIA INFUSION
150.0000 mg | Freq: Once | INTRAVENOUS | Status: AC
  Administered 2022-02-14: 150 mg via INTRAVENOUS
  Filled 2022-02-14: qty 83.34

## 2022-02-14 MED ORDER — ACETAMINOPHEN 500 MG PO TABS: 500.0000 mg | ORAL_TABLET | Freq: Four times a day (QID) | ORAL | Status: DC | PRN

## 2022-02-14 MED ORDER — DOXYLAMINE SUCCINATE (SLEEP) 25 MG PO TABS: 25.0000 mg | ORAL_TABLET | Freq: Every evening | ORAL | Status: DC | PRN

## 2022-02-14 MED ORDER — APIXABAN 5 MG PO TABS
5.0000 mg | ORAL_TABLET | Freq: Two times a day (BID) | ORAL | Status: DC
  Administered 2022-02-14 – 2022-02-16 (×4): 5 mg via ORAL
  Filled 2022-02-14 (×5): qty 1

## 2022-02-14 MED ORDER — CLOPIDOGREL BISULFATE 75 MG PO TABS
75.0000 mg | ORAL_TABLET | Freq: Every day | ORAL | Status: DC
Start: 2022-02-15 — End: 2022-02-16
  Administered 2022-02-15 – 2022-02-16 (×2): 75 mg via ORAL
  Filled 2022-02-14 (×2): qty 1

## 2022-02-14 MED ORDER — METOPROLOL TARTRATE 12.5 MG HALF TABLET
12.5000 mg | ORAL_TABLET | Freq: Two times a day (BID) | ORAL | Status: DC
  Administered 2022-02-14 – 2022-02-15 (×2): 12.5 mg via ORAL
  Filled 2022-02-14 (×2): qty 1

## 2022-02-14 MED ORDER — AMIODARONE HCL IN DEXTROSE 360-4.14 MG/200ML-% IV SOLN
60.0000 mg/h | INTRAVENOUS | Status: AC
  Administered 2022-02-14 (×2): 60 mg/h via INTRAVENOUS
  Filled 2022-02-14: qty 200

## 2022-02-14 NOTE — ED Notes (Signed)
Patient denies pain and is eating dinner comfortably. NAD.

## 2022-02-14 NOTE — ED Provider Notes (Signed)
[facility] EMERGENCY DEPARTMENT Provider Note   CSN: [id number] Arrival date & time: 02/14/22  1432     History {Add pertinent medical, surgical, social history, OB history to HPI:1} No chief complaint on file.   Kevin Larsen is a 66 y.o. male.  HPI     66 year old male with a history of coronary artery disease, with history of abnormal CTA with calcium score of 1067 leading to a cardiac catheterization December 20, 2021 with severe two-vessel disease of the RCA, LAD status post extensive PCI of the proximal to distal RCA with 3 overlapping stents, hypertension, hyperlipidemia, AAA, who presents with concern for atrial fibrillation.    Reports his mother has a history of the same.  4.5 mile walk in the heat, felt ok , HR normal , hydrated , in shade  Went down to get the soap, stood back up then he seemed to have near syncope , remember her saying "where did you go?:" and remember feeling lightheaded but did not fall down.   After shower to kitchen but was  sweating and short of breath. Took a nitro fo shortness of breath, diaphoresis  which are his anginal symptoms. Tried to stand up 10 minutes later then felt lightheaded and laid dowm.  Was breathing hard and then had syncopal episode.  Palpitations 1230PM  On Saturday he fell down , slow motion, did not hit head or anything  Past Medical History:  Diagnosis Date   Coronary artery disease    Hypertension      Home Medications Prior to Admission medications   Medication Sig Start Date End Date Taking? Authorizing Provider  acetaminophen (TYLENOL) 500 MG tablet Take 500 mg by mouth every 6 (six) hours as needed.    [provider]  aspirin EC 81 MG tablet Take 81 mg by mouth daily. Swallow whole.    [provider]  atorvastatin (LIPITOR) 80 MG tablet Take 1 tablet (80 mg total) by mouth daily. 12/20/21   Marykay Lex, MD  clopidogrel (PLAVIX) 75 MG tablet Take 1 tablet (75 mg total) by  mouth daily with breakfast. 12/21/21   Arty Baumgartner, NP  doxylamine, Sleep, (SLEEP AID) 25 MG tablet Take 25 mg by mouth at bedtime as needed for sleep. [place]    [provider]  losartan (COZAAR) 100 MG tablet Take 100 mg by mouth daily. 09/05/21   [provider]  naproxen sodium (ALEVE) 220 MG tablet Take 220 mg by mouth daily as needed (pain).    [provider]  nitroGLYCERIN (NITROSTAT) 0.4 MG SL tablet Place 1 tablet (0.4 mg total) under the tongue every 5 (five) minutes as needed for chest pain. 12/02/21 03/02/22  O'NealRonnald Ramp, MD  SUPER B COMPLEX/C PO Take 1 tablet by mouth every morning.    [provider]      Allergies    Patient has no known allergies.    Review of Systems   Review of Systems  Physical Exam Updated Vital Signs There were no vitals taken for this visit. Physical Exam  ED Results / Procedures / Treatments   Labs (all labs ordered are listed, but only abnormal results are displayed) Labs Reviewed - No data to display  EKG None  Radiology No results found.  Procedures Procedures  {Document cardiac monitor, telemetry assessment procedure when appropriate:1}  Medications Ordered in ED Medications - No data to display  ED Course/ Medical Decision Making/ A&P  Medical Decision Making  ***  {Document critical care time when appropriate:1} {Document review of labs and clinical decision tools ie heart score, Chads2Vasc2 etc:1}  {Document your independent review of radiology images, and any outside records:1} {Document your discussion with family members, caretakers, and with consultants:1} {Document social determinants of health affecting pt's care:1} {Document your decision making why or why not admission, treatments were needed:1} Final Clinical Impression(s) / ED Diagnoses Final diagnoses:  None    Rx / DC Orders ED Discharge Orders     None

## 2022-02-14 NOTE — H&P (Addendum)
Cardiology Admission History and Physical:   Patient ID: Kevin Larsen MRN: [id number]; DOB: March 23, 1956   Admission date: 02/14/2022  PCP:  Kathyrn Lass, MD   [facility] Providers Cardiologist:  Evalina Field, MD    Chief Complaint:  Atrial Fibrillation   Patient Profile:   Kevin Larsen is a 66 y.o. male with history of hyperlipidemia, hypertension who is being seen 02/14/2022 for the evaluation of atrial fibrillation.  History of Present Illness:   [name] is a 66 year old male with above medical history who is followed by Dr. Audie Box.  Per chart review, patient was referred to Dr. Audie Box in 08/2021 for evaluation of hyperlipidemia, dizziness. At his appointment on 09/13/2021, Dr. Audie Box recommended calcium scoring for further risk stratification.  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  Because of these findings, patien underwent CT coronary on 12/01/2021 that showed severe CAD with severe (70-99) stenosis noted in the distal LAD, proximal diagonal, proximal-mid RCA.  Studies were sent for [facility] which suggested stenoses in D1 and RCA were significantly flow-limiting.  He underwent echocardiogram on 12/06/2021 that showed EF 55-60%, mild LVH, normal LV diastolic parameters, normal RV systolic function, normal pulmonary artery systolic pressure.  There was mild, late systolic prolapse of multiple scallops of the posterior leaflet of the mitral valve.  Also noted mild dilation of the a ascending aorta measuring 41 mm.  Because of his abnormal coronary CT, patient underwent a left heart catheterization on 12/20/2021.  Catheterization showed severe disease in the RCA, LAD.  He underwent extensive PCI of the proximal to distal RCA with 3 overlapping stents.  Planned for medical management of LAD (LAD with 60% stenosis and D1 with 95% ostial stenosis).  Recommended aggressive risk factor  modification, could consider intervention if symptoms persist.  Patient was last seen by cardiology on 12/30/2021.  At that time, patient was doing well without chest pain, dizziness, fatigue, dyspnea.  Patient presented to the ED on 8/22 after he had a syncopal episode.  Reports that he felt like his heart was (working hard).  Heart rate was found to be in the 140s. EKG showed atrial fibrillation, HR 158 BPM, diffuse ST depression. CXR showed no acute cardiopulmonary disease. Labs showed Na 140, K 3.6, creatinine 1.17, mag 1.9, WBC 8.0, hemoglobin 14.8, platelets 223. Initial hsTn 6.   On interview, patient reports that about 1:00 today, he started to feel like his heart was "beating really fast".  He was in the shower, had bent over to pick something up, and upon standing felt his heart going really fast.  He felt dizzy, and like he was going to pass out.  He did not pass out while standing and he was able to walk to the bed.  While lying on the bed, he did feel like he lost consciousness.  Patient denies having any chest pain, shortness of breath during the episode.  He did feel somewhat clammy, sweaty.  Now, in the ER, patient reports feeling tired.  He continues to deny chest pain, shortness of breath, palpitations.  Patient is very active in his day-to-day life, often walks 5 miles per day.  He denies any recent chest pain or shortness of breath on exertion.  After he had his stent placed in June, he has noticed that his blood pressure has been low.  He has been having some dizziness when going from sitting to standing.  Past Medical History:  Diagnosis Date   Coronary artery disease    Hypertension     Past Surgical History:  Procedure Laterality Date   CORONARY STENT INTERVENTION N/A 12/20/2021   Procedure: CORONARY STENT INTERVENTION;  Surgeon: Marykay Lex, MD;  Location: [facility] INVASIVE CV LAB;  Service: Cardiovascular;  Laterality: N/A;   FETAL SURGERY FOR CONGENITAL HERNIA      INTRAVASCULAR PRESSURE WIRE/FFR STUDY N/A 12/20/2021   Procedure: INTRAVASCULAR PRESSURE WIRE/FFR STUDY;  Surgeon: Marykay Lex, MD;  Location: [facility] INVASIVE CV LAB;  Service: Cardiovascular;  Laterality: N/A;   LEFT HEART CATH AND CORONARY ANGIOGRAPHY N/A 12/20/2021   Procedure: LEFT HEART CATH AND CORONARY ANGIOGRAPHY;  Surgeon: Marykay Lex, MD;  Location: [facility] INVASIVE CV LAB;  Service: Cardiovascular;  Laterality: N/A;     Medications Prior to Admission: Prior to Admission medications   Medication Sig Start Date End Date Taking? Authorizing Provider  acetaminophen (TYLENOL) 500 MG tablet Take 500 mg by mouth every 6 (six) hours as needed.    [provider]  aspirin EC 81 MG tablet Take 81 mg by mouth daily. Swallow whole.    [provider]  atorvastatin (LIPITOR) 80 MG tablet Take 1 tablet (80 mg total) by mouth daily. 12/20/21   Marykay Lex, MD  clopidogrel (PLAVIX) 75 MG tablet Take 1 tablet (75 mg total) by mouth daily with breakfast. 12/21/21   Arty Baumgartner, NP  doxylamine, Sleep, (SLEEP AID) 25 MG tablet Take 25 mg by mouth at bedtime as needed for sleep. [place]    [provider]  losartan (COZAAR) 100 MG tablet Take 100 mg by mouth daily. 09/05/21   [provider]  naproxen sodium (ALEVE) 220 MG tablet Take 220 mg by mouth daily as needed (pain).    [provider]  nitroGLYCERIN (NITROSTAT) 0.4 MG SL tablet Place 1 tablet (0.4 mg total) under the tongue every 5 (five) minutes as needed for chest pain. 12/02/21 03/02/22  O'NealRonnald Ramp, MD  SUPER B COMPLEX/C PO Take 1 tablet by mouth every morning.    [provider]     Allergies:   No Known Allergies  Social History:   Social History   Socioeconomic History   Marital status: Married    Spouse name: Not on file   Number of children: 2   Years of education: Not on file   Highest education level: Not on file  Occupational History   Occupation: Business  analysis  Tobacco Use   Smoking status: Never   Smokeless tobacco: Never  Substance and Sexual Activity   Alcohol use: Not Currently   Drug use: Never   Sexual activity: Not on file  Other Topics Concern   Not on file  Social History Narrative   Not on file   Social Determinants of Health   Financial Resource Strain: Not on file  Food Insecurity: Not on file  Transportation Needs: Not on file  Physical Activity: Not on file  Stress: Not on file  Social Connections: Not on file  Intimate Partner Violence: Not on file    Family History:   The patient's family history includes Heart disease in his father.    ROS:  Please see the history of present illness.  All other ROS reviewed and negative.     Physical Exam/Data:   Vitals:   02/14/22 1605 02/14/22 1615 02/14/22 1620 02/14/22 1630  BP: (!) 84/67 101/80 94/74 103/74  Pulse: (!) 136 Marland Kitchen)  112 (!) 128 (!) 125  Resp: 19 12 16 19   Temp:      TempSrc:      SpO2: 97% 99% 96% 99%  Weight:      Height:       No intake or output data in the 24 hours ending 02/14/22 1638    02/14/2022    2:43 PM 12/30/2021   10:11 AM 12/20/2021    7:19 AM  Last 3 Weights  Weight (lbs) 208 lb 212 lb 9.6 oz 209 lb  Weight (kg) 94.348 kg 96.435 kg 94.802 kg     Body mass index is 28.21 kg/m.  General:  Well nourished, well developed, in no acute distress. Sitting comfortably in the bed  HEENT: normal Neck: no JVD Vascular: Radial pulses 2+ bilaterally   Cardiac:  normal S1, S2; irregular rate and rhythm, tachycardic  Lungs:  clear to auscultation bilaterally, no wheezing, rhonchi or rales  Abd: soft, nontender, no hepatomegaly  Ext: no edema Musculoskeletal:  No deformities, BUE and BLE strength normal and equal Skin: warm and dry  Neuro:  CNs 2-12 intact, no focal abnormalities noted Psych:  Normal affect    EKG:  The ECG that was done 02/14/2022 was personally reviewed and demonstrates   Relevant CV Studies:   Laboratory  Data:  High Sensitivity Troponin:   Recent Labs  Lab 02/14/22 1522  TROPONINIHS 6      Chemistry Recent Labs  Lab 02/14/22 1522  NA 140  K 3.6  CL 109  CO2 21*  GLUCOSE 98  BUN 14  CREATININE 1.17  CALCIUM 8.7*  MG 1.9  GFRNONAA >60  ANIONGAP 10    Recent Labs  Lab 02/14/22 1522  PROT 6.1*  ALBUMIN 3.9  AST 22  ALT 26  ALKPHOS 60  BILITOT 1.2   Lipids No results for input(s): "CHOL", "TRIG", "HDL", "LABVLDL", "LDLCALC", "CHOLHDL" in the last 168 hours. Hematology Recent Labs  Lab 02/14/22 1522  WBC 8.0  RBC 4.85  HGB 14.8  HCT 43.1  MCV 88.9  MCH 30.5  MCHC 34.3  RDW 11.9  PLT 223   Thyroid No results for input(s): "TSH", "FREET4" in the last 168 hours. BNPNo results for input(s): "BNP", "PROBNP" in the last 168 hours.  DDimer No results for input(s): "DDIMER" in the last 168 hours.   Radiology/Studies:  DG Chest Portable 1 View  Result Date: 02/14/2022 CLINICAL DATA:  A 66 year old presents with syncope and shortness of breath. EXAM: PORTABLE CHEST 1 VIEW COMPARISON:  March 16, 2009, December 01, 2021. FINDINGS: EKG leads project over the patient's chest. Trachea midline. Cardiomediastinal contours and hilar structures are normal. Lungs are clear.  No pneumothorax.  No sign of pleural effusion. On limited assessment no acute skeletal process. IMPRESSION: No acute cardiopulmonary disease. Electronically Signed   By: Zetta Bills M.D.   On: 02/14/2022 15:42     Assessment and Plan:   New Onset Atrial Fibrillation  Syncope  - Patient without a history of atrial fibrillation, presented after a syncopal episode and complaining of a fast heart beat. Found to be in atrial fibrillation with HR in the 140s-150s - BP soft in the ED, patient was started on IV amiodarone. This is not ideal as patient was not on [facility] prior to admission, but his BP would not tolerate bb or diltiazem  - Per Telemetry, HR is improving in the 120s. Patient overall asymptomatic, just  feels tired   - Echocardiogram from 12/06/2021 showed EF  55-60%, no regional wall motion abnormalities, mild LVH, normal LV diastolic parameters, normal pulmonary artery systolic pressure  - Start Eliquis 5 mg BID - Tentatively plan for TEE guided cardioversion on 8/24   - Patient has soft BP in the ED. Also reports having soft Bps at home recently and has been having dizziness upon standing since his stents were placed. Of note, his losartan was recently increased to 100 mg daily. Question if his BP is being overtreated.  - I suspect his syncopal episode occurred in the setting of elevated HR and low BP  - Hold losartan for now - BP has been improving his HR control   CAD s/p PCI to RCA  - Patient had extensive intervention with 3 DES to his RCA on 12/20/2021 - After his cath, patient was started on DAPT with ASA and plavix - Stop ASA, continue Plavix and eliquis  - Continue atorvastatin   Mild Mitral Valve Regurgitation  - Echo from 11/2021 noted mild MR with mild late systolic prolapse of multiple scallops of posterior leaflet of the mitral valve  - Continue to follow as an outpatient   Mixed LHD  - Continue atorvastatin 80 mg daily  Risk Assessment/Risk Scores:  {  CHA2DS2-VASc Score = 3  This indicates a 3.2% annual risk of stroke. The patient's score is based upon: CHF History: 0 HTN History: 1 Diabetes History: 0 Stroke History: 0 Vascular Disease History: 1 Age Score: 1 Gender Score: 0   Severity of Illness: The appropriate patient status for this patient is OBSERVATION. Observation status is judged to be reasonable and necessary in order to provide the required intensity of service to ensure the patient's safety. The patient's presenting symptoms, physical exam findings, and initial radiographic and laboratory data in the context of their medical condition is felt to place them at decreased risk for further clinical deterioration. Furthermore, it is anticipated that the  patient will be medically stable for discharge from the hospital within 2 midnights of admission.    For questions or updates, please contact [place] Please consult [email or web address] for contact info under     Signed, Margie Billet, PA-C  02/14/2022 4:38 PM   Patient seen and examined with Vikki Ports, PA-C.  Agree as above, with the following exceptions and changes as noted below.  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  He presents with new onset atrial fibrillation which likely began at 1 PM today.  He and his wife had taken a long walk which they have done for several years with only a brief hiatus after his recent PCI.  He came home and had profuse diaphoresis.  He notes that this was also the symptoms for which he initially sought out cardiology consultation but had not previously had palpitations.  Gen: NAD, CV: irregular and tachycardic, no murmurs, Lungs: clear, Abd: soft, Extrem: Warm, well perfused, no edema, Neuro/Psych: alert and oriented x 3, normal mood and affect. All available labs, radiology testing, previous records reviewed.  Patient has new onset atrial fibrillation.  He does snore and has not been evaluated for sleep apnea, this will need to be performed as an outpatient.  May have had mild dehydration but otherwise stays well-hydrated on his walks with his wife.  We will check TSH.  Unfortunately has been hypotensive limiting our ability to use rate control agents.  ER physician started amiodarone.  We discussed the potential risk for chemical  cardioversion however understanding that we have limited options for rate control given hypotension.  Patient and wife understand.  Rates have improved slightly on amiodarone.  Will start metoprolol and attempt to uptitrate.   -Start metoprolol 12.5 mg twice daily -Can continue IV amiodarone, uptitrate oral rate control agents as tolerated by BP -Eliquis has been started prior  to the initiation of IV amiodarone, discussed with pharmacist who notes heparin would not be able to be started until tomorrow morning, therefore discussed and shared decision making with patient and will continue Eliquis. -Plan for TEE cardioversion on Thursday if he remains in A-fib RVR. -We discussed the natural history of atrial fibrillation and strategies for management including rate and rhythm control as well as stroke risk reduction with anticoagulation.  We reviewed risk factors for atrial fibrillation including alcohol consumption, laboratory abnormalities such as thyroid disease, as well as sleep apnea particularly when untreated.  We reviewed the patient's personal history with regard to risk stratification for bleeding events.    Parke Poisson, MD 02/14/22 6:20 PM

## 2022-02-14 NOTE — ED Triage Notes (Addendum)
Pt BI from home  for a syncapal episode after his heart hfelt like it was "working hard" HR found to be 140. Pt alert and oriented xe4, diaphoretic and dizzy. Denies SOB and CP. 500cc NS given pt felt better after but no change in HR   Hx 3bn stents   102/68 90/palp 130-190 Afib  Cbg 110

## 2022-02-15 ENCOUNTER — Encounter (HOSPITAL_COMMUNITY): Payer: Self-pay | Admitting: Internal Medicine

## 2022-02-15 ENCOUNTER — Other Ambulatory Visit (HOSPITAL_COMMUNITY): Payer: Self-pay

## 2022-02-15 ENCOUNTER — Inpatient Hospital Stay (HOSPITAL_COMMUNITY): Payer: Medicare PPO | Admitting: Anesthesiology

## 2022-02-15 DIAGNOSIS — I4819 Other persistent atrial fibrillation: Secondary | ICD-10-CM

## 2022-02-15 LAB — BASIC METABOLIC PANEL
Anion gap: 4 — ABNORMAL LOW (ref 5–15)
BUN: 15 mg/dL (ref 8–23)
CO2: 24 mmol/L (ref 22–32)
Calcium: 8.8 mg/dL — ABNORMAL LOW (ref 8.9–10.3)
Chloride: 111 mmol/L (ref 98–111)
Creatinine, Ser: 1.13 mg/dL (ref 0.61–1.24)
GFR, Estimated: >60 mL/min (ref >60)
Glucose, Bld: 99 mg/dL (ref 70–99)
Potassium: 4 mmol/L (ref 3.5–5.1)
Sodium: 139 mmol/L (ref 135–145)

## 2022-02-15 MED ORDER — METOPROLOL TARTRATE 12.5 MG HALF TABLET
12.5000 mg | ORAL_TABLET | Freq: Once | ORAL | Status: AC
Start: 2022-02-15 — End: 2022-02-15
  Administered 2022-02-15: 12.5 mg via ORAL
  Filled 2022-02-15: qty 1

## 2022-02-15 MED ORDER — ORAL CARE MOUTH RINSE
15.0000 mL | OROMUCOSAL | Status: DC | PRN
Start: 2022-02-15 — End: 2022-02-16

## 2022-02-15 MED ORDER — METOPROLOL TARTRATE 25 MG PO TABS
25.0000 mg | ORAL_TABLET | Freq: Two times a day (BID) | ORAL | Status: DC
  Administered 2022-02-15 – 2022-02-16 (×2): 25 mg via ORAL
  Filled 2022-02-15 (×2): qty 1

## 2022-02-15 NOTE — Progress Notes (Addendum)
Progress Note  Patient Name: Kevin Larsen Date of Encounter: 02/15/2022  [facility] Cardiologist: Evalina Field, MD   Subjective   Patient denies chest pain, palpitations, sob. Feels "off", and is hopeful his cardioversion tomorrow will be successful.   Inpatient Medications    Scheduled Meds:  apixaban  5 mg Oral Q12H   aspirin  81 mg Oral Daily   atorvastatin  80 mg Oral Daily   clopidogrel  75 mg Oral Q breakfast   metoprolol tartrate  12.5 mg Oral BID   Continuous Infusions:  amiodarone 30 mg/hr (02/15/22 0624)   PRN Meds: acetaminophen, doxylamine (Sleep), ondansetron (ZOFRAN) IV   Vital Signs    Vitals:   02/15/22 0816 02/15/22 1022 02/15/22 1023 02/15/22 1024  BP: 120/69  120/86 120/86  Pulse: 95  80 80  Resp:      Temp: 97.7 F (36.5 C) 97.8 F (36.6 C)    TempSrc: Oral Oral    SpO2: 98%  98% 95%  Weight:      Height:        Intake/Output Summary (Last 24 hours) at 02/15/2022 1042 Last data filed at 02/15/2022 0900 Gross per 24 hour  Intake 595.13 ml  Output --  Net 595.13 ml      02/15/2022    3:28 AM 02/14/2022    2:43 PM 12/30/2021   10:11 AM  Last 3 Weights  Weight (lbs) 212 lb 15.4 oz 208 lb 212 lb 9.6 oz  Weight (kg) 96.6 kg 94.348 kg 96.435 kg      Telemetry    Atrial fibrillation, HR predominantly in the 90s-100s - Personally Reviewed  ECG    No new tracings - Personally Reviewed  Physical Exam   GEN: No acute distress.  Sitting comfortably in the bed  Neck: No JVD Cardiac: Irregular rate and rhythm, tachycardic. Radial pulses 2+ bilaterally.  Respiratory: Clear to auscultation bilaterally. GI: Soft, nontender, non-distended  MS: No edema; No deformity. Neuro:  Nonfocal  Psych: Normal affect   Labs    High Sensitivity Troponin:   Recent Labs  Lab 02/14/22 1522 02/14/22 1727  TROPONINIHS 6 17     Chemistry Recent Labs  Lab 02/14/22 1522 02/15/22 0427  NA 140 139  K 3.6 4.0  CL 109 111  CO2 21* 24   GLUCOSE 98 99  BUN 14 15  CREATININE 1.17 1.13  CALCIUM 8.7* 8.8*  MG 1.9  --   PROT 6.1*  --   ALBUMIN 3.9  --   AST 22  --   ALT 26  --   ALKPHOS 60  --   BILITOT 1.2  --   GFRNONAA >60 >60  ANIONGAP 10 4*    Lipids No results for input(s): "CHOL", "TRIG", "HDL", "LABVLDL", "LDLCALC", "CHOLHDL" in the last 168 hours.  Hematology Recent Labs  Lab 02/14/22 1522  WBC 8.0  RBC 4.85  HGB 14.8  HCT 43.1  MCV 88.9  MCH 30.5  MCHC 34.3  RDW 11.9  PLT 223   Thyroid  Recent Labs  Lab 02/14/22 1749  TSH 1.863    BNPNo results for input(s): "BNP", "PROBNP" in the last 168 hours.  DDimer No results for input(s): "DDIMER" in the last 168 hours.   Radiology    DG Chest Portable 1 View  Result Date: 02/14/2022 CLINICAL DATA:  A 66 year old presents with syncope and shortness of breath. EXAM: PORTABLE CHEST 1 VIEW COMPARISON:  March 16, 2009, December 01, 2021. FINDINGS: EKG  leads project over the patient's chest. Trachea midline. Cardiomediastinal contours and hilar structures are normal. Lungs are clear.  No pneumothorax.  No sign of pleural effusion. On limited assessment no acute skeletal process. IMPRESSION: No acute cardiopulmonary disease. Electronically Signed   By: Donzetta Kohut M.D.   On: 02/14/2022 15:42    Cardiac Studies   LHC 12/20/2021  Diagnostic Dominance: Right  Intervention    Echocardiogram 12/06/2021 1. Left ventricular ejection fraction, by estimation, is 55 to 60%. The  left ventricle has normal function. The left ventricle has no regional  wall motion abnormalities. There is mild left ventricular hypertrophy of  the basal-septal segment. Left  ventricular diastolic parameters were normal.   2. Right ventricular systolic function is normal. The right ventricular  size is normal. There is normal pulmonary artery systolic pressure.   3. The mitral valve is abnormal. Mild mitral valve regurgitation. No  evidence of mitral stenosis. There is mild  late systolic prolapse of  multiple scallops of the posterior leaflet of the mitral valve.   4. The aortic valve is grossly normal. There is mild calcification of the  aortic valve. Aortic valve regurgitation is not visualized. No aortic  stenosis is present.   5. Aortic dilatation noted. There is mild dilatation of the ascending  aorta, measuring 41 mm.   6. The inferior vena cava is normal in size with greater than 50%  respiratory variability, suggesting right atrial pressure of 3 mmHg.   Comparison(s): No prior Echocardiogram.   Conclusion(s)/Recommendation(s): Difficult images, but mild prolapse of  posterior mitral valve leaflet with mild MR. Mild dilation of ascending  aorta.   Patient Profile     66 y.o. male with history of hyperlipidemia, hypertension who is being seen for the evaluation of atrial fibrillation  Assessment & Plan    New Onset Atrial Fibrillation  - Patient without a history of atrial fibrillation, presented after a syncopal episode and complaining of a fast heart beat. Found to be in atrial fibrillation with HR in the 140s-150s - BP was soft in the ED, patient was started on IV amiodarone. - Echocardiogram from 12/06/2021 showed EF 55-60%, no regional wall motion abnormalities, mild LVH, normal LV diastolic parameters, normal pulmonary artery systolic pressure  - Started Eliquis 5 mg BID yesterday  - Continue IV amiodarone  - Increase metoprolol to 25 mg BID for better HR control  - Patient planned for TEE guided cardioversion tomorrow. He will have received at least 3 doses of eliquis by then   Syncope  Hypotension  - Patient reports having frequent dizziness upon standing since his losartan was increased to 100 mg daily  - BP was soft in the ED (SBP in the 80s) - I suspect his syncopal episode occurred in the setting of elevated HR and low BP. He was previously taking losartan 100 mg daily-- of note, BP is  120s/80s this AM after holding losartan. Suspect  we will either DC or reduced the dose of his losartan at discharge   CAD s/p PCI to RCA  - Patient had extensive intervention with 3 DES to his RCA on 12/20/2021 - After his cath, patient was started on DAPT with ASA and plavix - Continue ASA, eliquis, and plavix for now. Will discuss with our interventionalist colleagues if it is OK to stop ASA as patient has overlapping stents  - Continue atorvastatin    Mild Mitral Valve Regurgitation  - Echo from 11/2021 noted mild MR with mild late systolic  prolapse of multiple scallops of posterior leaflet of the mitral valve  - Continue to follow as an outpatient    Mixed LHD  - Continue atorvastatin 80 mg daily  For questions or updates, please contact [facility] Please consult [email or web address] for contact info under        Signed, Jonita Albee, PA-C  02/15/2022, 10:42 AM    Patient seen and examined with KJ PA-C.  Agree as above, with the following exceptions and changes as noted below.  Patient feels well today without chest pain or shortness of breath, remains in atrial fibrillation with rapid ventricular response but blood pressure is much better today.  He notes that he took his losartan yesterday morning and after taking nitroglycerin when he felt unwell he experienced hypotensive syncope. Gen: NAD, CV: Irregular and tachycardic, no murmurs, Lungs: clear, Abd: soft, Extrem: Warm, well perfused, no edema, Neuro/Psych: alert and oriented x 3, normal mood and affect. All available labs, radiology testing, previous records reviewed.  Discussed in detail plans for TEE cardioversion tomorrow with Dr. Tenny Craw.  Discussed ambulatory monitoring of atrial fibrillation with [facility] mobile.  Discussed risk factors for atrial fibrillation and detection of arrhythmia at home to better understand symptoms.  He primarily has shortness of breath as his presenting symptoms for both coronary disease as well as atrial fibrillation.  I suspect he may have had  paroxysms of atrial fibrillation in the past contributing to some of his symptoms.  His wife notes that he does have positional dizziness, we may need to decrease the dose of his losartan home-going particularly if he leaves on rate control medications.  He continues on Eliquis 5 mg twice daily and continues on IV amiodarone given hypotension with other rate control agents yesterday.  I am uptitrating his metoprolol today.  Parke Poisson, MD 02/15/22 11:47 AM

## 2022-02-15 NOTE — Discharge Instructions (Signed)
  Heart Healthy Diet  Stop your asprin, no longer needed with eliquis but do continue plavix.    We started your losartan back at lower dose.  You are now on Metoprolol 25 mg twice per day. Call the office with any questions or answers.     Information on my medicine - ELIQUIS (apixaban)  Why was Eliquis prescribed for you? Eliquis was prescribed for you to reduce the risk of a blood clot forming that can cause a stroke if you have a medical condition called atrial fibrillation (a type of irregular heartbeat).  What do You need to know about Eliquis ? Take your Eliquis TWICE DAILY - one tablet in the morning and one tablet in the evening with or without food. If you have difficulty swallowing the tablet whole please discuss with your pharmacist how to take the medication safely.  Take Eliquis exactly as prescribed by your doctor and DO NOT stop taking Eliquis without talking to the doctor who prescribed the medication.  Stopping may increase your risk of developing a stroke.  Refill your prescription before you run out.  After discharge, you should have regular check-up appointments with your healthcare provider that is prescribing your Eliquis.  In the future your dose may need to be changed if your kidney function or weight changes by a significant amount or as you get older.  What do you do if you miss a dose? If you miss a dose, take it as soon as you remember on the same day and resume taking twice daily.  Do not take more than one dose of ELIQUIS at the same time to make up a missed dose.  Important Safety Information A possible side effect of Eliquis is bleeding. You should call your healthcare provider right away if you experience any of the following: Bleeding from an injury or your nose that does not stop. Unusual colored urine (red or dark brown) or unusual colored stools (red or black). Unusual bruising for unknown reasons. A serious fall or if you hit your head (even  if there is no bleeding).  Some medicines may interact with Eliquis and might increase your risk of bleeding or clotting while on Eliquis. To help avoid this, consult your healthcare provider or pharmacist prior to using any new prescription or non-prescription medications, including herbals, vitamins, non-steroidal anti-inflammatory drugs (NSAIDs) and supplements.  This website has more information on Eliquis (apixaban): http://www.eliquis.com/eliquis/home

## 2022-02-15 NOTE — Progress Notes (Signed)
   02/15/22 0816  Assess: MEWS Score  Temp 97.7 F (36.5 C)  BP 120/69  MAP (mmHg) 84  Pulse Rate 95  ECG Heart Rate (!) 112  SpO2 98 %  O2 Device Room Air  Assess: MEWS Score  MEWS Temp 0  MEWS Systolic 0  MEWS Pulse 2  MEWS RR 1  MEWS LOC 0  MEWS Score 3  MEWS Score Color Yellow  Assess: if the MEWS score is Yellow or Red  Were vital signs taken at a resting state? Yes  Focused Assessment No change from prior assessment  Does the patient meet 2 or more of the SIRS criteria? No  MEWS guidelines implemented *See Row Information* Yes  Treat  MEWS Interventions Other (Comment);Administered scheduled meds/treatments (pt on IV amio)  Pain Scale 0-10  Pain Score 0  Take Vital Signs  Increase Vital Sign Frequency  Yellow: Q 2hr X 2 then Q 4hr X 2, if remains yellow, continue Q 4hrs  Escalate  MEWS: Escalate Yellow: discuss with charge nurse/RN and consider discussing with provider and RRT  Notify: Charge Nurse/RN  Name of Charge Nurse/RN Notified Tarini Carrier RN  Date Charge Nurse/RN Notified 02/15/22  Time Charge Nurse/RN Notified [phone]  Document  Patient Outcome Other (Comment) (stable remains on unit)  Progress note created (see row info) Yes  Assess: SIRS CRITERIA  SIRS Temperature  0  SIRS Pulse 1  SIRS Respirations  1  SIRS WBC 1  SIRS Score Sum  3

## 2022-02-15 NOTE — Care Management (Signed)
  Transition of Care (TOC) Screening Note   Patient Details  Name: Kevin Larsen Date of Birth: 1956/05/03   Transition of Care [facility]) CM/SW Contact:    Gala Lewandowsky, RN Phone Number: 02/15/2022, 3:29 PM    Transition of Care Department [facility]) has reviewed the patient and no TOC needs have been identified at this time. We will continue to monitor patient advancement through interdisciplinary progression rounds. If new patient transition needs arise, please place a TOC consult.

## 2022-02-15 NOTE — ED Notes (Signed)
Paged cardiology for new order for patient.  Progressive bed needed.

## 2022-02-15 NOTE — H&P (View-Only) (Signed)
Progress Note  Patient Name: Kevin Larsen Date of Encounter: 02/15/2022  [facility] Cardiologist: Evalina Field, MD   Subjective   Patient denies chest pain, palpitations, sob. Feels "off", and is hopeful his cardioversion tomorrow will be successful.   Inpatient Medications    Scheduled Meds:  apixaban  5 mg Oral Q12H   aspirin  81 mg Oral Daily   atorvastatin  80 mg Oral Daily   clopidogrel  75 mg Oral Q breakfast   metoprolol tartrate  12.5 mg Oral BID   Continuous Infusions:  amiodarone 30 mg/hr (02/15/22 0624)   PRN Meds: acetaminophen, doxylamine (Sleep), ondansetron (ZOFRAN) IV   Vital Signs    Vitals:   02/15/22 0816 02/15/22 1022 02/15/22 1023 02/15/22 1024  BP: 120/69  120/86 120/86  Pulse: 95  80 80  Resp:      Temp: 97.7 F (36.5 C) 97.8 F (36.6 C)    TempSrc: Oral Oral    SpO2: 98%  98% 95%  Weight:      Height:        Intake/Output Summary (Last 24 hours) at 02/15/2022 1042 Last data filed at 02/15/2022 0900 Gross per 24 hour  Intake 595.13 ml  Output --  Net 595.13 ml      02/15/2022    3:28 AM 02/14/2022    2:43 PM 12/30/2021   10:11 AM  Last 3 Weights  Weight (lbs) 212 lb 15.4 oz 208 lb 212 lb 9.6 oz  Weight (kg) 96.6 kg 94.348 kg 96.435 kg      Telemetry    Atrial fibrillation, HR predominantly in the 90s-100s - Personally Reviewed  ECG    No new tracings - Personally Reviewed  Physical Exam   GEN: No acute distress.  Sitting comfortably in the bed  Neck: No JVD Cardiac: Irregular rate and rhythm, tachycardic. Radial pulses 2+ bilaterally.  Respiratory: Clear to auscultation bilaterally. GI: Soft, nontender, non-distended  MS: No edema; No deformity. Neuro:  Nonfocal  Psych: Normal affect   Labs    High Sensitivity Troponin:   Recent Labs  Lab 02/14/22 1522 02/14/22 1727  TROPONINIHS 6 17     Chemistry Recent Labs  Lab 02/14/22 1522 02/15/22 0427  NA 140 139  K 3.6 4.0  CL 109 111  CO2 21* 24   GLUCOSE 98 99  BUN 14 15  CREATININE 1.17 1.13  CALCIUM 8.7* 8.8*  MG 1.9  --   PROT 6.1*  --   ALBUMIN 3.9  --   AST 22  --   ALT 26  --   ALKPHOS 60  --   BILITOT 1.2  --   GFRNONAA >60 >60  ANIONGAP 10 4*    Lipids No results for input(s): "CHOL", "TRIG", "HDL", "LABVLDL", "LDLCALC", "CHOLHDL" in the last 168 hours.  Hematology Recent Labs  Lab 02/14/22 1522  WBC 8.0  RBC 4.85  HGB 14.8  HCT 43.1  MCV 88.9  MCH 30.5  MCHC 34.3  RDW 11.9  PLT 223   Thyroid  Recent Labs  Lab 02/14/22 1749  TSH 1.863    BNPNo results for input(s): "BNP", "PROBNP" in the last 168 hours.  DDimer No results for input(s): "DDIMER" in the last 168 hours.   Radiology    DG Chest Portable 1 View  Result Date: 02/14/2022 CLINICAL DATA:  A 66 year old presents with syncope and shortness of breath. EXAM: PORTABLE CHEST 1 VIEW COMPARISON:  March 16, 2009, December 01, 2021. FINDINGS: EKG  leads project over the patient's chest. Trachea midline. Cardiomediastinal contours and hilar structures are normal. Lungs are clear.  No pneumothorax.  No sign of pleural effusion. On limited assessment no acute skeletal process. IMPRESSION: No acute cardiopulmonary disease. Electronically Signed   By: Donzetta Kohut M.D.   On: 02/14/2022 15:42    Cardiac Studies   LHC 12/20/2021  Diagnostic Dominance: Right  Intervention    Echocardiogram 12/06/2021 1. Left ventricular ejection fraction, by estimation, is 55 to 60%. The  left ventricle has normal function. The left ventricle has no regional  wall motion abnormalities. There is mild left ventricular hypertrophy of  the basal-septal segment. Left  ventricular diastolic parameters were normal.   2. Right ventricular systolic function is normal. The right ventricular  size is normal. There is normal pulmonary artery systolic pressure.   3. The mitral valve is abnormal. Mild mitral valve regurgitation. No  evidence of mitral stenosis. There is mild  late systolic prolapse of  multiple scallops of the posterior leaflet of the mitral valve.   4. The aortic valve is grossly normal. There is mild calcification of the  aortic valve. Aortic valve regurgitation is not visualized. No aortic  stenosis is present.   5. Aortic dilatation noted. There is mild dilatation of the ascending  aorta, measuring 41 mm.   6. The inferior vena cava is normal in size with greater than 50%  respiratory variability, suggesting right atrial pressure of 3 mmHg.   Comparison(s): No prior Echocardiogram.   Conclusion(s)/Recommendation(s): Difficult images, but mild prolapse of  posterior mitral valve leaflet with mild MR. Mild dilation of ascending  aorta.   Patient Profile     66 y.o. male with history of hyperlipidemia, hypertension who is being seen for the evaluation of atrial fibrillation  Assessment & Plan    New Onset Atrial Fibrillation  - Patient without a history of atrial fibrillation, presented after a syncopal episode and complaining of a fast heart beat. Found to be in atrial fibrillation with HR in the 140s-150s - BP was soft in the ED, patient was started on IV amiodarone. - Echocardiogram from 12/06/2021 showed EF 55-60%, no regional wall motion abnormalities, mild LVH, normal LV diastolic parameters, normal pulmonary artery systolic pressure  - Started Eliquis 5 mg BID yesterday  - Continue IV amiodarone  - Increase metoprolol to 25 mg BID for better HR control  - Patient planned for TEE guided cardioversion tomorrow. He will have received at least 3 doses of eliquis by then   Syncope  Hypotension  - Patient reports having frequent dizziness upon standing since his losartan was increased to 100 mg daily  - BP was soft in the ED (SBP in the 80s) - I suspect his syncopal episode occurred in the setting of elevated HR and low BP. He was previously taking losartan 100 mg daily-- of note, BP is  120s/80s this AM after holding losartan. Suspect  we will either DC or reduced the dose of his losartan at discharge   CAD s/p PCI to RCA  - Patient had extensive intervention with 3 DES to his RCA on 12/20/2021 - After his cath, patient was started on DAPT with ASA and plavix - Continue ASA, eliquis, and plavix for now. Will discuss with our interventionalist colleagues if it is OK to stop ASA as patient has overlapping stents  - Continue atorvastatin    Mild Mitral Valve Regurgitation  - Echo from 11/2021 noted mild MR with mild late systolic  prolapse of multiple scallops of posterior leaflet of the mitral valve  - Continue to follow as an outpatient    Mixed LHD  - Continue atorvastatin 80 mg daily  For questions or updates, please contact [facility] Please consult [email or web address] for contact info under        Signed, Jonita Albee, PA-C  02/15/2022, 10:42 AM    Patient seen and examined with KJ PA-C.  Agree as above, with the following exceptions and changes as noted below.  Patient feels well today without chest pain or shortness of breath, remains in atrial fibrillation with rapid ventricular response but blood pressure is much better today.  He notes that he took his losartan yesterday morning and after taking nitroglycerin when he felt unwell he experienced hypotensive syncope. Gen: NAD, CV: Irregular and tachycardic, no murmurs, Lungs: clear, Abd: soft, Extrem: Warm, well perfused, no edema, Neuro/Psych: alert and oriented x 3, normal mood and affect. All available labs, radiology testing, previous records reviewed.  Discussed in detail plans for TEE cardioversion tomorrow with Dr. Tenny Craw.  Discussed ambulatory monitoring of atrial fibrillation with [employer] [employer].  Discussed risk factors for atrial fibrillation and detection of arrhythmia at home to better understand symptoms.  He primarily has shortness of breath as his presenting symptoms for both coronary disease as well as atrial fibrillation.  I suspect he may have had  paroxysms of atrial fibrillation in the past contributing to some of his symptoms.  His wife notes that he does have positional dizziness, we may need to decrease the dose of his losartan home-going particularly if he leaves on rate control medications.  He continues on Eliquis 5 mg twice daily and continues on IV amiodarone given hypotension with other rate control agents yesterday.  I am uptitrating his metoprolol today.  Parke Poisson, MD 02/15/22 11:47 AM

## 2022-02-15 NOTE — ED Notes (Signed)
ED TO INPATIENT HANDOFF REPORT  ED Nurse Name and Phone #: Corrie Dandy, RN  S Name/Age/Gender Kevin Larsen 66 y.o. male Room/Bed: 046C/046C  Code Status   Code Status: Full Code  Home/SNF/Other Home Patient oriented to: self, place, time, and situation Is this baseline? Yes   Triage Complete: Triage complete  Chief Complaint Atrial fibrillation [facility]) [I48.91]  Triage Note Pt BI from home  for a syncapal episode after his heart hfelt like it was "working hard" HR found to be 140. Pt alert and oriented xe4, diaphoretic and dizzy. Denies SOB and CP. 500cc NS given pt felt better after but no change in HR   Hx 3bn stents   102/68 90/palp 130-190 Afib  Cbg 110   Allergies No Known Allergies  Level of Care/Admitting Diagnosis ED Disposition     ED Disposition  Admit   Condition  --   Comment  Hospital [facility]: [facility] [[id number]]  Level of Care: Telemetry Cardiac [[id number]]  May admit patient to [facility] or [facility] if equivalent level of care is available:: No  Covid Evaluation: Asymptomatic - no recent exposure (last 10 days) testing not required  Diagnosis: Atrial fibrillation (HCC) [427.31.ICD-9-CM]  Admitting Physician: Parke Poisson [[id number]]  Attending Physician: Parke Poisson [[id number]]  Certification:: I certify this patient will need inpatient services for at least 2 midnights  Estimated Length of Stay: 3          B Medical/Surgery History Past Medical History:  Diagnosis Date   Coronary artery disease    Hypertension    Past Surgical History:  Procedure Laterality Date   CORONARY STENT INTERVENTION N/A 12/20/2021   Procedure: CORONARY STENT INTERVENTION;  Surgeon: Marykay Lex, MD;  Location: [facility] INVASIVE CV LAB;  Service: Cardiovascular;  Laterality: N/A;   FETAL SURGERY FOR CONGENITAL HERNIA     INTRAVASCULAR PRESSURE WIRE/FFR STUDY N/A 12/20/2021   Procedure: INTRAVASCULAR PRESSURE WIRE/FFR STUDY;  Surgeon: Marykay Lex, MD;  Location: [facility] INVASIVE CV LAB;  Service: Cardiovascular;  Laterality: N/A;   LEFT HEART CATH AND CORONARY ANGIOGRAPHY N/A 12/20/2021   Procedure: LEFT HEART CATH AND CORONARY ANGIOGRAPHY;  Surgeon: Marykay Lex, MD;  Location: [facility] INVASIVE CV LAB;  Service: Cardiovascular;  Laterality: N/A;     A IV Location/Drains/Wounds Patient Lines/Drains/Airways Status     Active Line/Drains/Airways     Name Placement date Placement time Site Days   Peripheral IV 02/14/22 20 G Left Antecubital 02/14/22  1440  Antecubital  1   Peripheral IV 02/14/22 18 G Anterior;Right Forearm 02/14/22  1452  Forearm  1            Intake/Output Last 24 hours No intake or output data in the 24 hours ending 02/15/22 0050  Labs/Imaging Results for orders placed or performed during the hospital encounter of 02/14/22 (from the past 48 hour(s))  CBC with Differential     Status: None   Collection Time: 02/14/22  3:22 PM  Result Value Ref Range   WBC 8.0 4.0 - 10.5 K/uL   RBC 4.85 4.22 - 5.81 MIL/uL   Hemoglobin 14.8 13.0 - 17.0 g/dL   HCT 17.5 10.2 - 58.5 %   MCV 88.9 80.0 - 100.0 fL   MCH 30.5 26.0 - 34.0 pg   MCHC 34.3 30.0 - 36.0 g/dL   RDW 27.7 82.4 - 23.5 %   Platelets 223 150 - 400 K/uL   nRBC 0.0 0.0 - 0.2 %  Neutrophils Relative % 77 %   Neutro Abs 6.2 1.7 - 7.7 K/uL   Lymphocytes Relative 11 %   Lymphs Abs 0.9 0.7 - 4.0 K/uL   Monocytes Relative 9 %   Monocytes Absolute 0.7 0.1 - 1.0 K/uL   Eosinophils Relative 2 %   Eosinophils Absolute 0.1 0.0 - 0.5 K/uL   Basophils Relative 1 %   Basophils Absolute 0.1 0.0 - 0.1 K/uL   Immature Granulocytes 0 %   Abs Immature Granulocytes 0.02 0.00 - 0.07 K/uL    Comment: Performed at [facility] Lab, [address], [place], [place] [address]  Comprehensive metabolic panel     Status: Abnormal   Collection Time: 02/14/22  3:22 PM  Result Value Ref Range   Sodium 140 135 - 145 mmol/L   Potassium 3.6 3.5 - 5.1 mmol/L   Chloride 109  98 - 111 mmol/L   CO2 21 (L) 22 - 32 mmol/L   Glucose, Bld 98 70 - 99 mg/dL    Comment: Glucose reference range applies only to samples taken after fasting for at least 8 hours.   BUN 14 8 - 23 mg/dL   Creatinine, Ser 9.38 0.61 - 1.24 mg/dL   Calcium 8.7 (L) 8.9 - 10.3 mg/dL   Total Protein 6.1 (L) 6.5 - 8.1 g/dL   Albumin 3.9 3.5 - 5.0 g/dL   AST 22 15 - 41 U/L   ALT 26 0 - 44 U/L   Alkaline Phosphatase 60 38 - 126 U/L   Total Bilirubin 1.2 0.3 - 1.2 mg/dL   GFR, Estimated >18 >29 mL/min    Comment: (NOTE) Calculated using the CKD-EPI Creatinine Equation (2021)    Anion gap 10 5 - 15    Comment: Performed at [facility], [address], [place], [place] [address]  Magnesium     Status: None   Collection Time: 02/14/22  3:22 PM  Result Value Ref Range   Magnesium 1.9 1.7 - 2.4 mg/dL    Comment: Performed at [facility], [address] [place], [place] [address]  Troponin I (High Sensitivity)     Status: None   Collection Time: 02/14/22  3:22 PM  Result Value Ref Range   Troponin I (High Sensitivity) 6 <18 ng/L    Comment: (NOTE) Elevated high sensitivity troponin I (hsTnI) values and significant  changes across serial measurements may suggest ACS but many other  chronic and acute conditions are known to elevate hsTnI results.  Refer to the "Links" section for chest pain algorithms and additional  guidance. Performed at [facility], [address], [place], [place] [address]   Troponin I (High Sensitivity)     Status: None   Collection Time: 02/14/22  5:27 PM  Result Value Ref Range   Troponin I (High Sensitivity) 17 <18 ng/L    Comment: (NOTE) Elevated high sensitivity troponin I (hsTnI) values and significant  changes across serial measurements may suggest ACS but many other  chronic and acute conditions are known to elevate hsTnI results.  Refer to the "Links" section for chest pain algorithms and additional  guidance. Performed at [facility], [address], [place], [place] [address]   TSH     Status: None   Collection Time: 02/14/22  5:49 PM  Result Value Ref Range   TSH 1.863 0.350 - 4.500 uIU/mL    Comment: Performed by a 3rd Generation assay with a functional sensitivity of <=  0.01 uIU/mL. Performed at [facility], [place] [address] [place], [place] [address]    DG Chest Portable 1 View  Result Date: 02/14/2022 CLINICAL DATA:  A 66 year old presents with syncope and shortness of breath. EXAM: PORTABLE CHEST 1 VIEW COMPARISON:  March 16, 2009, December 01, 2021. FINDINGS: EKG leads project over the patient's chest. Trachea midline. Cardiomediastinal contours and hilar structures are normal. Lungs are clear.  No pneumothorax.  No sign of pleural effusion. On limited assessment no acute skeletal process. IMPRESSION: No acute cardiopulmonary disease. Electronically Signed   By: Zetta Bills M.D.   On: 02/14/2022 15:42    Pending Labs Unresulted Labs (From admission, onward)     Start     Ordered   02/15/22 [id number]  Basic metabolic panel  Daily at 5am,   R      02/14/22 1730            Vitals/Pain Today's Vitals   02/14/22 2200 02/14/22 2215 02/14/22 2300 02/15/22 0000  BP: 115/87 101/68 113/83 111/82  Pulse: 72 92 82 98  Resp: (!) 23 (!) 22 (!) 22 20  Temp:  98 F (36.7 C)    TempSrc:  Oral    SpO2: 97% 97% 96% 97%  Weight:      Height:      PainSc:        Isolation Precautions No active isolations  Medications Medications  apixaban (ELIQUIS) tablet 5 mg (5 mg Oral Given 02/14/22 1613)  amiodarone (NEXTERONE) 1.8 mg/mL load via infusion 150 mg (150 mg Intravenous Bolus from Bag 02/14/22 1618)    Followed by  amiodarone (NEXTERONE PREMIX) 360-4.14 MG/200ML-% (1.8 mg/mL) IV infusion (0 mg/hr Intravenous Stopped 02/14/22 2214)    Followed by  amiodarone (NEXTERONE PREMIX) 360-4.14 MG/200ML-% (1.8 mg/mL) IV infusion (30 mg/hr Intravenous Rate/Dose Change 02/14/22 2133)  acetaminophen (TYLENOL)  tablet 500 mg (has no administration in time range)  atorvastatin (LIPITOR) tablet 80 mg (80 mg Oral Not Given 02/14/22 1849)  doxylamine (Sleep) (UNISOM) tablet 25 mg (has no administration in time range)  clopidogrel (PLAVIX) tablet 75 mg (has no administration in time range)  ondansetron (ZOFRAN) injection 4 mg (has no administration in time range)  metoprolol tartrate (LOPRESSOR) tablet 12.5 mg (12.5 mg Oral Given 02/14/22 1943)  aspirin chewable tablet 81 mg (has no administration in time range)  diltiazem (CARDIZEM) injection 10 mg (10 mg Intravenous Given 02/14/22 1541)  sodium chloride 0.9 % bolus 1,000 mL (0 mLs Intravenous Stopped 02/14/22 1739)    Mobility walks with person assist Low fall risk   Focused Assessments Neuro Assessment Handoff:  Swallow screen pass? Yes          Neuro Assessment: Within Defined Limits Neuro Checks:      Last Documented NIHSS Modified Score:   Has TPA been given? No If patient is a Neuro Trauma and patient is going to OR before floor call report to [facility] nurse: [phone] or [phone]   R Recommendations: See Admitting Provider Note  Report given to:   Additional Notes: pt is AAOx4. Pt is on room air.

## 2022-02-15 NOTE — Progress Notes (Signed)
Pt converted to SR at 1730. Emelda Brothers RN

## 2022-02-15 NOTE — ED Notes (Signed)
MD messaged and paged and requested to have pt's admission order be switched to progressive before taking pt to floor.

## 2022-02-15 NOTE — Anesthesia Preprocedure Evaluation (Signed)
Anesthesia Evaluation    Reviewed: Allergy & Precautions, Patient's Chart, lab work & pertinent test results  History of Anesthesia Complications Negative for: history of anesthetic complications  Airway        Dental   Pulmonary neg pulmonary ROS,           Cardiovascular hypertension, Pt. on medications + CAD  + dysrhythmias Atrial Fibrillation   TTE 12/06/21: EF 55-60%, mild LVH, mild MR, mild late systolic prolapse of multiple scallops of the posterior leaflet of the mitral valve stenosis is present, mild dilatation of the ascending aorta, measuring 38m   Neuro/Psych negative neurological ROS  negative psych ROS   GI/Hepatic negative GI ROS, Neg liver ROS,   Endo/Other  negative endocrine ROS  Renal/GU negative Renal ROS  negative genitourinary   Musculoskeletal negative musculoskeletal ROS (+)   Abdominal   Peds  Hematology negative hematology ROS (+)   Anesthesia Other Findings Day of surgery medications reviewed with patient.  Reproductive/Obstetrics negative OB ROS                             Anesthesia Physical Anesthesia Plan  ASA: 3  Anesthesia Plan: MAC   Post-op Pain Management: Minimal or no pain anticipated   Induction:   PONV Risk Score and Plan: 1 and Treatment may vary due to age or medical condition and Propofol infusion  Airway Management Planned: Natural Airway and Nasal Cannula  Additional Equipment: None  Intra-op Plan:   Post-operative Plan:   Informed Consent:   Plan Discussed with:   Anesthesia Plan Comments:         Anesthesia Quick Evaluation

## 2022-02-16 ENCOUNTER — Other Ambulatory Visit (HOSPITAL_COMMUNITY): Payer: Self-pay

## 2022-02-16 ENCOUNTER — Encounter (HOSPITAL_COMMUNITY): Payer: Self-pay | Admitting: Internal Medicine

## 2022-02-16 ENCOUNTER — Other Ambulatory Visit (HOSPITAL_COMMUNITY): Payer: Medicare PPO

## 2022-02-16 ENCOUNTER — Encounter (HOSPITAL_COMMUNITY)
Admission: EM | Disposition: A | Discharge status: Home / Self Care | Payer: Self-pay | Source: Home / Self Care | Attending: Internal Medicine

## 2022-02-16 DIAGNOSIS — I4819 Other persistent atrial fibrillation: Secondary | ICD-10-CM | POA: Diagnosis not present

## 2022-02-16 DIAGNOSIS — R55 Syncope and collapse: Secondary | ICD-10-CM | POA: Diagnosis present

## 2022-02-16 DIAGNOSIS — I2510 Atherosclerotic heart disease of native coronary artery without angina pectoris: Secondary | ICD-10-CM | POA: Diagnosis present

## 2022-02-16 DIAGNOSIS — I340 Nonrheumatic mitral (valve) insufficiency: Secondary | ICD-10-CM | POA: Diagnosis present

## 2022-02-16 LAB — BASIC METABOLIC PANEL
Anion gap: 7 (ref 5–15)
BUN: 13 mg/dL (ref 8–23)
CO2: 26 mmol/L (ref 22–32)
Calcium: 8.8 mg/dL — ABNORMAL LOW (ref 8.9–10.3)
Chloride: 106 mmol/L (ref 98–111)
Creatinine, Ser: 1.22 mg/dL (ref 0.61–1.24)
GFR, Estimated: >60 mL/min (ref >60)
Glucose, Bld: 108 mg/dL — ABNORMAL HIGH (ref 70–99)
Potassium: 4 mmol/L (ref 3.5–5.1)
Sodium: 139 mmol/L (ref 135–145)

## 2022-02-16 SURGERY — CANCELLED PROCEDURE
Anesthesia: Monitor Anesthesia Care

## 2022-02-16 MED ORDER — APIXABAN 5 MG PO TABS
5.0000 mg | ORAL_TABLET | Freq: Two times a day (BID) | ORAL | 6 refills | Status: DC
  Filled 2022-02-16: qty 60, 30d supply, fill #0

## 2022-02-16 MED ORDER — METOPROLOL TARTRATE 25 MG PO TABS
25.0000 mg | ORAL_TABLET | Freq: Two times a day (BID) | ORAL | 6 refills | Status: DC
  Filled 2022-02-16: qty 60, 30d supply, fill #0

## 2022-02-16 MED ORDER — SODIUM CHLORIDE 0.9 % IV SOLN: INTRAVENOUS | Status: DC

## 2022-02-16 MED ORDER — LOSARTAN POTASSIUM 25 MG PO TABS
25.0000 mg | ORAL_TABLET | Freq: Every day | ORAL | Status: DC
  Administered 2022-02-16: 25 mg via ORAL
  Filled 2022-02-16: qty 1

## 2022-02-16 MED ORDER — LOSARTAN POTASSIUM 25 MG PO TABS
25.0000 mg | ORAL_TABLET | Freq: Every day | ORAL | 6 refills | Status: AC
  Filled 2022-02-16: qty 30, 30d supply, fill #0

## 2022-02-16 NOTE — Discharge Summary (Addendum)
Discharge Summary    Patient ID: Kevin Larsen MRN: [id number]; DOB: July 03, 1955  Admit date: 02/14/2022 Discharge date: 02/16/2022  PCP:  Kathyrn Lass, MD   [facility] Providers Cardiologist:  Evalina Field, MD        Discharge Diagnoses    Principal Problem:   Atrial fibrillation [facility]) Active Problems:   Syncope and collapse   Mitral valve regurgitation   CAD in native artery    Diagnostic Studies/Procedures    None this admit _____________   History of Present Illness     Kevin Larsen is a 66 y.o. male with  history of hyperlipidemia, hypertension and CAD.  Pt had chest discomfort in June and eventual cath with severe disease of RCA and LAD.  Had PCI, extensive of RCA with 3 overlapping stents.   Planned for medical management of LAD (LAD with 60% stenosis and D1 with 95% ostial stenosis).  Pt presented to ER after syncopal episode 02/14/22.  He felt his heart racing and found to be in atrial fib with HR 158.  He had diffuse ST depression, CXR NAD, hs troponin 6.  Lytes stable.  He was admitted, placed on po BB and IV amiodarone due to hypotension.  Hospital Course     Consultants: none  Pt's losartan was held, it had been at higher dose and syncope was felt to be combination of atrial fib and hypotension.  (On arrival BP 100/70 to 92/78).   Losartan was held.    On 02/15/22 pt converted to SR.  We cancelled his planned TEE DCCV.    Today his amiodarone has been stopped.  He will continue with losartan 25 only and metoprolol tartrate 25 BID.  His orthostatic BPs were stable today.  Labs stable as well.    Dr. Margaretann Loveless has seen and evaluated and discussed meds with pt and wife.  He is stable for discharge.  We will stop ASA as we have now added eliquis. Continue plavix.  CHA2DS2VASc of 3.    Did the patient have an acute coronary syndrome (MI, NSTEMI, STEMI, etc) this admission?:  No                               Did the patient have a percutaneous coronary  intervention (stent / angioplasty)?:  No.          _____________  Discharge Vitals Blood pressure (!) 84/59, pulse 82, temperature 97.9 F (36.6 C), temperature source Oral, resp. rate 19, height 6' (1.829 m), weight 212 lb 15.4 oz (96.6 kg), SpO2 99 %.  Filed Weights   02/14/22 1443 02/15/22 0328  Weight: 94.3 kg 96.6 kg    Labs & Radiologic Studies    CBC Recent Labs    02/14/22 1522  WBC 8.0  NEUTROABS 6.2  HGB 14.8  HCT 43.1  MCV 88.9  PLT [id number]   Basic Metabolic Panel Recent Labs    02/14/22 1522 02/15/22 0427 02/16/22 0409  NA 140 139 139  K 3.6 4.0 4.0  CL 109 111 106  CO2 21* 24 26  GLUCOSE 98 99 108*  BUN 14 15 13   CREATININE 1.17 1.13 1.22  CALCIUM 8.7* 8.8* 8.8*  MG 1.9  --   --    Liver Function Tests Recent Labs    02/14/22 1522  AST 22  ALT 26  ALKPHOS 60  BILITOT 1.2  PROT 6.1*  ALBUMIN 3.9   No results for  input(s): "LIPASE", "AMYLASE" in the last 72 hours. High Sensitivity Troponin:   Recent Labs  Lab 02/14/22 1522 02/14/22 1727  TROPONINIHS 6 17    BNP Invalid input(s): "POCBNP" D-Dimer No results for input(s): "DDIMER" in the last 72 hours. Hemoglobin A1C No results for input(s): "HGBA1C" in the last 72 hours. Fasting Lipid Panel No results for input(s): "CHOL", "HDL", "LDLCALC", "TRIG", "CHOLHDL", "LDLDIRECT" in the last 72 hours. Thyroid Function Tests Recent Labs    02/14/22 1749  TSH 1.863   _____________  DG Chest Portable 1 View  Result Date: 02/14/2022 CLINICAL DATA:  A 66 year old presents with syncope and shortness of breath. EXAM: PORTABLE CHEST 1 VIEW COMPARISON:  March 16, 2009, December 01, 2021. FINDINGS: EKG leads project over the patient's chest. Trachea midline. Cardiomediastinal contours and hilar structures are normal. Lungs are clear.  No pneumothorax.  No sign of pleural effusion. On limited assessment no acute skeletal process. IMPRESSION: No acute cardiopulmonary disease. Electronically Signed   By:  Donzetta Kohut M.D.   On: 02/14/2022 15:42    Disposition   Pt is being discharged home today in good condition.  Follow-up Plans & Appointments    Heart Healthy Diet  Stop your asprin, no longer needed with eliquis but do continue plavix.    We started your losartan back at lower dose.  You are now on Metoprolol 25 mg twice per day. Call the office with any questions or answers.     Follow-up Information     O'Neal, Ronnald Ramp, MD Follow up on 02/28/2022.   Specialties: Cardiology, Internal Medicine, Radiology Why: at 10:05 AM with his Nurse Practitioner Irving Burton  please arrive 15 min early to check in. Contact information: [address] [place] [address] [phone]                  Discharge Medications   Allergies as of 02/16/2022   Not on File      Medication List     STOP taking these medications    aspirin EC 81 MG tablet   naproxen sodium 220 MG tablet Commonly known as: ALEVE       TAKE these medications    acetaminophen 500 MG tablet Commonly known as: TYLENOL Take 500 mg by mouth every 6 (six) hours as needed.   apixaban 5 MG Tabs tablet Commonly known as: ELIQUIS Take 1 tablet (5 mg total) by mouth every 12 (twelve) hours.   atorvastatin 80 MG tablet Commonly known as: LIPITOR Take 1 tablet (80 mg total) by mouth daily.   clopidogrel 75 MG tablet Commonly known as: PLAVIX Take 1 tablet (75 mg total) by mouth daily with breakfast.   losartan 25 MG tablet Commonly known as: COZAAR Take 1 tablet (25 mg total) by mouth daily. What changed:  medication strength how much to take   metoprolol tartrate 25 MG tablet Commonly known as: LOPRESSOR Take 1 tablet (25 mg total) by mouth 2 (two) times daily.   nitroGLYCERIN 0.4 MG SL tablet Commonly known as: NITROSTAT Place 1 tablet (0.4 mg total) under the tongue every 5 (five) minutes as needed for chest pain.   Sleep Aid 25 MG tablet Generic drug: doxylamine (Sleep) Take  25 mg by mouth at bedtime as needed for sleep. [place]   SUPER B COMPLEX/C PO Take 1 tablet by mouth every morning.           Outstanding Labs/Studies   none  Duration of Discharge Encounter   Greater than  30 minutes including physician time.  Signed, Nada Boozer, NP 02/16/2022, 10:18 AM   See daily progress note. Patient was dismissed in stable condition. Parke Poisson, MD

## 2022-02-16 NOTE — Interval H&P Note (Deleted)
History and Physical Interval Note:  02/16/2022 7:54 AM  Kevin Larsen  has presented today for surgery, with the diagnosis of atrial fibrillation.  The various methods of treatment have been discussed with the patient and family. After consideration of risks, benefits and other options for treatment, the patient has consented to  Procedure(s): TRANSESOPHAGEAL ECHOCARDIOGRAM (TEE) (N/A) CARDIOVERSION (N/A) as a surgical intervention.  The patient's history has been reviewed, patient examined, no change in status, stable for surgery.  I have reviewed the patient's chart and labs.  Questions were answered to the patient's satisfaction.     Dietrich Pates

## 2022-02-16 NOTE — Progress Notes (Addendum)
Progress Note  Patient Name: Kevin Larsen Date of Encounter: 02/16/2022  [facility] Cardiologist: Reatha Harps, MD   Subjective   Converted to SR around 1730 yesterday on amio  No chest pain no SOB  Inpatient Medications    Scheduled Meds:  apixaban  5 mg Oral Q12H   aspirin  81 mg Oral Daily   atorvastatin  80 mg Oral Daily   clopidogrel  75 mg Oral Q breakfast   metoprolol tartrate  25 mg Oral BID   Continuous Infusions:  amiodarone 30 mg/hr (02/16/22 0630)   PRN Meds: acetaminophen, doxylamine (Sleep), ondansetron (ZOFRAN) IV, mouth rinse   Vital Signs    Vitals:   02/15/22 2028 02/15/22 2035 02/15/22 2344 02/16/22 0337  BP: 112/83 112/83 116/78 122/76  Pulse: 60 62 (!) 56 (!) 51  Resp:      Temp: 98.5 F (36.9 C)  97.6 F (36.4 C) 97.7 F (36.5 C)  TempSrc: Oral  Oral Oral  SpO2: 94%  97% 97%  Weight:      Height:        Intake/Output Summary (Last 24 hours) at 02/16/2022 0746 Last data filed at 02/15/2022 2000 Gross per 24 hour  Intake 986.59 ml  Output --  Net 986.59 ml      02/15/2022    3:28 AM 02/14/2022    2:43 PM 12/30/2021   10:11 AM  Last 3 Weights  Weight (lbs) 212 lb 15.4 oz 208 lb 212 lb 9.6 oz  Weight (kg) 96.6 kg 94.348 kg 96.435 kg      Telemetry    SR to SB  - Personally Reviewed  ECG    SB no changes with ST - Personally Reviewed  Physical Exam   GEN: No acute distress.   Neck: No JVD Cardiac: RRR, no murmurs, rubs, or gallops.  Respiratory: Clear to auscultation bilaterally. GI: Soft, nontender, non-distended  MS: No edema; No deformity. Neuro:  Nonfocal  Psych: Normal affect   Labs    High Sensitivity Troponin:   Recent Labs  Lab 02/14/22 1522 02/14/22 1727  TROPONINIHS 6 17     Chemistry Recent Labs  Lab 02/14/22 1522 02/15/22 0427 02/16/22 0409  NA 140 139 139  K 3.6 4.0 4.0  CL 109 111 106  CO2 21* 24 26  GLUCOSE 98 99 108*  BUN 14 15 13   CREATININE 1.17 1.13 1.22  CALCIUM 8.7* 8.8*  8.8*  MG 1.9  --   --   PROT 6.1*  --   --   ALBUMIN 3.9  --   --   AST 22  --   --   ALT 26  --   --   ALKPHOS 60  --   --   BILITOT 1.2  --   --   GFRNONAA >60 >60 >60  ANIONGAP 10 4* 7    Lipids No results for input(s): "CHOL", "TRIG", "HDL", "LABVLDL", "LDLCALC", "CHOLHDL" in the last 168 hours.  Hematology Recent Labs  Lab 02/14/22 1522  WBC 8.0  RBC 4.85  HGB 14.8  HCT 43.1  MCV 88.9  MCH 30.5  MCHC 34.3  RDW 11.9  PLT 223   Thyroid  Recent Labs  Lab 02/14/22 1749  TSH 1.863    BNPNo results for input(s): "BNP", "PROBNP" in the last 168 hours.  DDimer No results for input(s): "DDIMER" in the last 168 hours.   Radiology    DG Chest Portable 1 View  Result Date: 02/14/2022  CLINICAL DATA:  A 66 year old presents with syncope and shortness of breath. EXAM: PORTABLE CHEST 1 VIEW COMPARISON:  March 16, 2009, December 01, 2021. FINDINGS: EKG leads project over the patient's chest. Trachea midline. Cardiomediastinal contours and hilar structures are normal. Lungs are clear.  No pneumothorax.  No sign of pleural effusion. On limited assessment no acute skeletal process. IMPRESSION: No acute cardiopulmonary disease. Electronically Signed   By: Donzetta Kohut M.D.   On: 02/14/2022 15:42    Cardiac Studies   LHC 12/20/2021  Diagnostic Dominance: Right  Intervention      Echocardiogram 12/06/2021 1. Left ventricular ejection fraction, by estimation, is 55 to 60%. The  left ventricle has normal function. The left ventricle has no regional  wall motion abnormalities. There is mild left ventricular hypertrophy of  the basal-septal segment. Left  ventricular diastolic parameters were normal.   2. Right ventricular systolic function is normal. The right ventricular  size is normal. There is normal pulmonary artery systolic pressure.   3. The mitral valve is abnormal. Mild mitral valve regurgitation. No  evidence of mitral stenosis. 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   4. The aortic valve is grossly normal. There is mild calcification of the  aortic valve. Aortic valve regurgitation is not visualized. No aortic  stenosis is present.   5. Aortic dilatation noted. There is mild dilatation of the ascending  aorta, measuring 41 mm.   6. The inferior vena cava is normal in size with greater than 50%  respiratory variability, suggesting right atrial pressure of 3 mmHg.   Comparison(s): No prior Echocardiogram.   Conclusion(s)/Recommendation(s): Difficult images, but mild prolapse of  posterior mitral valve leaflet with mild MR. Mild dilation of ascending  aorta.   Patient Profile     66 y.o. male with history of hyperlipidemia, hypertension who is being seen for the evaluation of atrial fibrillation    Assessment & Plan    New Onset Atrial Fibrillation  - Patient without a history of atrial fibrillation, presented after a syncopal episode and complaining of a fast heart beat. Found to be in atrial fibrillation with HR in the 140s-150s - BP was soft in the ED, patient was started on IV amiodarone. - Echocardiogram from 12/06/2021 showed EF 55-60%, no regional wall motion abnormalities, mild LVH, normal LV diastolic parameters, normal pulmonary artery systolic pressure  - Started Eliquis 5 mg BID 02/14/22 - will stop IV amiodarone  - Increase metoprolol to 25 mg BID for better HR control  - Patient planned for TEE guided cardioversion today but now in SR so cancelled.  --plan for discharge today   Syncope  Hypotension  - Patient reports having frequent dizziness upon standing since his losartan was increased to 100 mg daily  - BP was soft in the ED (SBP in the 80s) - I suspect his syncopal episode occurred in the setting of elevated HR and low BP. He was previously taking losartan 100 mg daily-- of note, BP is  120s/80s  to 137 systolic.  Holding losartan. Suspect we will either DC or  reduced the dose of his losartan at discharge  now on BB -once in SR check orthostatic BP   CAD s/p PCI to RCA  - Patient had extensive intervention with 3 DES to his RCA on 12/20/2021 - After his cath, patient was started on DAPT with ASA and plavix - Continue eliquis, and plavix for now.  Will discuss with our interventionalist colleagues if it is OK to stop ASA as patient has overlapping stents  - d/c aspirin - Continue atorvastatin    Mild Mitral Valve Regurgitation  - Echo from 11/2021 noted mild MR with mild late systolic prolapse of multiple scallops of posterior leaflet of the mitral valve  - Continue to follow as an outpatient    Mixed LHD  - Continue atorvastatin 80 mg daily    For questions or updates, please contact [place] Please consult [email or web address] for contact info under        Signed, Cecilie Kicks, NP  02/16/2022, 7:46 AM     Patient seen and examined with Cecilie Kicks NP.  Agree as above, with the following exceptions and changes as noted below. Feels well in SR. Gen: NAD, CV: RRR, no murmurs, Lungs: clear, Abd: soft, Extrem: Warm, well perfused, no edema, Neuro/Psych: alert and oriented x 3, normal mood and affect. All available labs, radiology testing, previous records reviewed. Converted to SR yesterday evening. D/c amiodarone. Continue eliquis and plavix for afib chadsvasc of 3 (HTN, age, vasc dz - CAD). D/c aspirin. Will drop dose of losartan to 25 mg daily and continue metoprolol 25 mg BID. F/u 2 weeks with BP log for appt. Stop losartan if dizzy.     Elouise Munroe, MD 02/16/22 9:47 AM

## 2022-02-28 ENCOUNTER — Telehealth: Payer: Self-pay | Admitting: *Deleted

## 2022-02-28 ENCOUNTER — Ambulatory Visit: Payer: Medicare PPO | Attending: Nurse Practitioner | Admitting: Nurse Practitioner

## 2022-02-28 ENCOUNTER — Encounter: Payer: Self-pay | Admitting: Nurse Practitioner

## 2022-02-28 ENCOUNTER — Encounter (HOSPITAL_BASED_OUTPATIENT_CLINIC_OR_DEPARTMENT_OTHER): Payer: Medicare PPO | Admitting: Cardiology

## 2022-02-28 ENCOUNTER — Telehealth: Payer: Self-pay

## 2022-02-28 VITALS — BP 126/80 | HR 56 | Ht 72.0 in | Wt 213.4 lb

## 2022-02-28 DIAGNOSIS — R55 Syncope and collapse: Secondary | ICD-10-CM | POA: Diagnosis not present

## 2022-02-28 DIAGNOSIS — E785 Hyperlipidemia, unspecified: Secondary | ICD-10-CM

## 2022-02-28 DIAGNOSIS — I340 Nonrheumatic mitral (valve) insufficiency: Secondary | ICD-10-CM | POA: Diagnosis not present

## 2022-02-28 DIAGNOSIS — I10 Essential (primary) hypertension: Secondary | ICD-10-CM

## 2022-02-28 DIAGNOSIS — I480 Paroxysmal atrial fibrillation: Secondary | ICD-10-CM | POA: Diagnosis not present

## 2022-02-28 DIAGNOSIS — I2510 Atherosclerotic heart disease of native coronary artery without angina pectoris: Secondary | ICD-10-CM

## 2022-02-28 DIAGNOSIS — R0683 Snoring: Secondary | ICD-10-CM

## 2022-02-28 DIAGNOSIS — I4819 Other persistent atrial fibrillation: Secondary | ICD-10-CM

## 2022-02-28 NOTE — Telephone Encounter (Signed)
Stacy notified ok to activate itamar device

## 2022-02-28 NOTE — Patient Instructions (Signed)
Medication Instructions:  Your physician recommends that you continue on your current medications as directed. Please refer to the Current Medication list given to you today.   *If you need a refill on your cardiac medications before your next appointment, please call your pharmacy*   Lab Work: NONE ordered at this time of appointment   If you have labs (blood work) drawn today and your tests are completely normal, you will receive your results only by: MyChart Message (if you have MyChart) OR A paper copy in the mail If you have any lab test that is abnormal or we need to change your treatment, we will call you to review the results.   Testing/Procedures: Your physician has recommended that you have a sleep study. This test records several body functions during sleep, including: brain activity, eye movement, oxygen and carbon dioxide blood levels, heart rate and rhythm, breathing rate and rhythm, the flow of air through your mouth and nose, snoring, body muscle movements, and chest and belly movement.    Follow-Up: At [facility], you and your health needs are our priority.  As part of our continuing mission to provide you with exceptional heart care, we have created designated Provider Care Teams.  These Care Teams include your primary Cardiologist (physician) and Advanced Practice Providers (APPs -  Physician Assistants and Nurse Practitioners) who all work together to provide you with the care you need, when you need it.  We recommend signing up for the patient portal called "MyChart".  Sign up information is provided on this After Visit Summary.  MyChart is used to connect with patients for Virtual Visits (Telemedicine).  Patients are able to view lab/test results, encounter notes, upcoming appointments, etc.  Non-urgent messages can be sent to your provider as well.   To learn more about what you can do with MyChart, go to [email or web address].    Your next appointment:     Keep Follow up  The format for your next appointment:   In Person  Provider:   Reatha Harps, MD     Other Instructions   Important Information About Sugar

## 2022-02-28 NOTE — Progress Notes (Signed)
Office Visit    Patient Name: Kevin Larsen Date of Encounter: 02/28/2022  Primary Care Provider:  Sigmund Hazel, MD Primary Cardiologist:  Reatha Harps, MD  Chief Complaint    66 year old male with a history of CAD s/p DES x3 overlapping-p-dRCA in 11/2021,  LAD/D1 stenosis managed medically, syncope, paroxysmal atrial fibrillation, hypertension, and hyperlipidemia who presents for follow-up related to CAD and new onset atrial fibrillation.  Past Medical History    Past Medical History:  Diagnosis Date   Coronary artery disease    Hypertension    Past Surgical History:  Procedure Laterality Date   CORONARY STENT INTERVENTION N/A 12/20/2021   Procedure: CORONARY STENT INTERVENTION;  Surgeon: Marykay Lex, MD;  Location: [facility] INVASIVE CV LAB;  Service: Cardiovascular;  Laterality: N/A;   FETAL SURGERY FOR CONGENITAL HERNIA     INTRAVASCULAR PRESSURE WIRE/FFR STUDY N/A 12/20/2021   Procedure: INTRAVASCULAR PRESSURE WIRE/FFR STUDY;  Surgeon: Marykay Lex, MD;  Location: [facility] INVASIVE CV LAB;  Service: Cardiovascular;  Laterality: N/A;   LEFT HEART CATH AND CORONARY ANGIOGRAPHY N/A 12/20/2021   Procedure: LEFT HEART CATH AND CORONARY ANGIOGRAPHY;  Surgeon: Marykay Lex, MD;  Location: [facility] INVASIVE CV LAB;  Service: Cardiovascular;  Laterality: N/A;    Allergies  Not on File  History of Present Illness    66 year old male with the above past medical history including CAD s/p DES x3 overlapping-p-dRCA in 11/2021,  LAD/D1 stenosis managed medically, syncope, paroxysmal atrial fibrillation, hypertension, and hyperlipidemia.  Coronary CTA in 11/2021 showed severe stenosis in the RCA as well as first diagonal branch, positive by FFR.  He was asymptomatic at the time.  Echocardiogram showed mild MR with mild late systolic prolapse of multiple scallops of posterior leaflet of the mitral valve, otherwise normal.  EKG was unremarkable.  He underwent cardiac catheterization in 11/2021 which  revealed diffuse RCA disease s/p DES x3 overlapping in the p-dRCA, large caliber LAD with 60% lesion, 95% ostial D1, and 50% m-d D1 disease (RFR negative).  He was started on DAPT with aspirin and Plavix, as well as Lipitor.  Medical management was recommended for residual disease, however, consideration for PCI was recommended if recurrent symptoms.  He was last seen in the office on 12/30/2021 and was stable from a cardiac standpoint.  He denied symptoms concerning for angina. He presented to the ED on 02/14/2022 following a syncopal episode.  Prior to his syncopal episode he noted dyspnea, diaphoresis.  Upon arrival to the ED he was noted to be in atrial fibrillation with RVR.  He was also hypotensive with SBP in the 80s.  Labs showed no significant electrolyte abnormalities, troponin was negative.  Cardiology was consulted.  He was hospitalized from 02/14/2022 to 02/16/2022 in the setting of new onset atrial fibrillation.  He did not tolerate diltiazem due to hypotension.  He was started on amiodarone and Eliquis (CHA2DS2VASc = 3).  He was scheduled for TEE guided cardioversion, however, this was later canceled as the patient converted to NSR.  Amiodarone was discontinued.  He was discharged home in stable condition on 02/16/2022.  He presents today for follow-up.  Since his recent hospitalization he has done well from a cardiac standpoint.  He purchased a [employer] (life [employer]) for home monitoring. He denies any recurrent atrial fibrillation, denies syncope.  He states he was told during his hospitalization that he would be referred for a sleep study, however, I do not see evidence of a referral.  He is asking if  this can be completed today.  Other than his recent episode of atrial fibrillation, he reports feeling well and denies any additional concerns today.  Home Medications    Current Outpatient Medications  Medication Sig Dispense Refill   acetaminophen (TYLENOL) 500 MG tablet Take 500 mg by mouth every 6  (six) hours as needed.     apixaban (ELIQUIS) 5 MG TABS tablet Take 1 tablet (5 mg total) by mouth every 12 (twelve) hours. 60 tablet 6   atorvastatin (LIPITOR) 80 MG tablet Take 1 tablet (80 mg total) by mouth daily. 90 tablet 3   clopidogrel (PLAVIX) 75 MG tablet Take 1 tablet (75 mg total) by mouth daily with breakfast. 90 tablet 2   doxylamine, Sleep, (SLEEP AID) 25 MG tablet Take 25 mg by mouth at bedtime as needed for sleep. [place]     losartan (COZAAR) 25 MG tablet Take 1 tablet (25 mg total) by mouth daily. 30 tablet 6   metoprolol tartrate (LOPRESSOR) 25 MG tablet Take 1 tablet (25 mg total) by mouth 2 (two) times daily. 60 tablet 6   nitroGLYCERIN (NITROSTAT) 0.4 MG SL tablet Place 1 tablet (0.4 mg total) under the tongue every 5 (five) minutes as needed for chest pain. 90 tablet 3   SUPER B COMPLEX/C PO Take 1 tablet by mouth every morning.     No current facility-administered medications for this visit.     Review of Systems    He denies chest pain, palpitations, dyspnea, pnd, orthopnea, n, v, dizziness, syncope, edema, weight gain, or early satiety. All other systems reviewed and are otherwise negative except as noted above.   Physical Exam    VS:  BP 126/80   Pulse (!) 56   Ht 6' (1.829 m)   Wt 213 lb 6.4 oz (96.8 kg)   SpO2 98%   BMI 28.94 kg/m   GEN: Well nourished, well developed, in no acute distress. HEENT: normal. Neck: Supple, no JVD, carotid bruits, or masses. Cardiac: RRR, no murmurs, rubs, or gallops. No clubbing, cyanosis, edema.  Radials/DP/PT 2+ and equal bilaterally.  Respiratory:  Respirations regular and unlabored, clear to auscultation bilaterally. GI: Soft, nontender, nondistended, BS + x 4. MS: no deformity or atrophy. Skin: warm and dry, no rash. Neuro:  Strength and sensation are intact. Psych: Normal affect.  Accessory Clinical Findings    ECG personally reviewed by me today -sinus bradycardia, 56 bpm, PACs- no acute changes.   Lab  Results  Component Value Date   WBC 8.0 02/14/2022   HGB 14.8 02/14/2022   HCT 43.1 02/14/2022   MCV 88.9 02/14/2022   PLT 223 02/14/2022   Lab Results  Component Value Date   CREATININE 1.22 02/16/2022   BUN 13 02/16/2022   NA 139 02/16/2022   K 4.0 02/16/2022   CL 106 02/16/2022   CO2 26 02/16/2022   Lab Results  Component Value Date   ALT 26 02/14/2022   AST 22 02/14/2022   ALKPHOS 60 02/14/2022   BILITOT 1.2 02/14/2022   No results found for: "CHOL", "HDL", "LDLCALC", "LDLDIRECT", "TRIG", "CHOLHDL"  No results found for: "HGBA1C"  Assessment & Plan    1. Paroxysmal atrial fibrillation/syncope: Maintaining NSR. Syncope occurred in the setting of hypotension, rapid A-fib.  Denies any recurrent syncope. Continue Eliquis, metoprolol.   2. CAD: Cath in 11/2021 revealed diffuse RCA disease s/p DES x3 overlapping in the p-dRCA, large caliber LAD with 60% lesion, 95% ostial D1, and 50% m-d D1 disease (RFR  negative).  He was started on DAPT with aspirin and Plavix, as well as Lipitor.  Medical management was recommended for residual disease, however, consideration for PCI was recommended if recurrent symptoms. Stable with no anginal symptoms.  Continue Plavix, losartan, metoprolol, and Lipitor.  3. Mitral valve regurgitation: Echo in 11/2021 showed EF 55 to 60%, normal LV function, mild LVH of the basal-septal segment, normal RV systolic function, mild mitral valve regurgitation with mild late systolic prolapse of multiple scallops of the posterior leaflet of the mitral valve. Consider repeat echo as clinically indicated.   4. History of snoring: Epworth scale of 2, STOP-Bang score of 5. Will order [facility] home sleep study to rule out sleep apnea given history of snoring and new diagnosis of atrial fibrillation.  5. Hypertension: BP well controlled. Continue current antihypertensive regimen.   6. Hyperlipidemia: LDL was 55 in 12/2021.  Continue Plavix, Lipitor.  7. Disposition:  Follow-up as scheduled with Dr. Flora Lipps in 04/2022.      Joylene Grapes, NP 02/28/2022, 12:09 PM

## 2022-02-28 NOTE — Telephone Encounter (Signed)
Called and made the patient aware that He may proceed with the [facility] Sleep Study. PIN # provided to the patient. Patient made aware that He will be contacted after the test has been read with the results and any recommendations. Patient verbalized understanding and thanked me for the call.   

## 2022-03-01 ENCOUNTER — Telehealth: Payer: Self-pay | Admitting: *Deleted

## 2022-03-01 ENCOUNTER — Other Ambulatory Visit: Payer: Self-pay | Admitting: Cardiology

## 2022-03-01 ENCOUNTER — Ambulatory Visit: Payer: Medicare PPO | Attending: Nurse Practitioner

## 2022-03-01 DIAGNOSIS — I480 Paroxysmal atrial fibrillation: Secondary | ICD-10-CM

## 2022-03-01 DIAGNOSIS — G4736 Sleep related hypoventilation in conditions classified elsewhere: Secondary | ICD-10-CM

## 2022-03-01 DIAGNOSIS — R0683 Snoring: Secondary | ICD-10-CM

## 2022-03-01 NOTE — Telephone Encounter (Signed)
Patient returned a call to me and was given sleep study results and recommendations. He agrees to proceed with in lab sleep study pending [employer] approval.

## 2022-03-01 NOTE — Telephone Encounter (Signed)
Message left to return a call to discuss sleep study results and recommendations. 

## 2022-03-01 NOTE — Procedures (Signed)
     SLEEP STUDY REPORT Patient Information Study Date: 02/28/22 Patient Name: Kevin Larsen Patient ID: [id number] Birth Date: 06/28/2055 Age: 65 Gender: Male BMI: 29.0 (W=214 lb, H=6' 0'') Stopbang: 5 Referring Physician: Bernadene Person, NP  TEST DESCRIPTION: Home sleep apnea testing was completed using the WatchPat, a Type 1 device, utilizing peripheral arterial tonometry (PAT), chest movement, actigraphy, pulse oximetry, pulse rate, body position and snore. AHI was calculated with apnea and hypopnea using valid sleep time as the denominator. RDI includes apneas, hypopneas, and RERAs. The data acquired and the scoring of sleep and all associated events were performed in accordance with the recommended standards and specifications as outlined in the AASM Manual for the Scoring of Sleep and Associated Events 2.2.0 (2015).   FINDINGS: 1.  No evidence of Obstructive Sleep Apnea with AHI 4/hr.  2.  No Central Sleep Apnea. 3.  Oxygen desaturations as low as 73%. 4.  Mild to moderate snoring was present. O2 sats were < 88% for 0.6 minutes. 5.  Total sleep time was 7 hrs and 13 min. 6.  19.7% of total sleep time was spent in REM sleep.  7.  Normal sleep onset latency at 18 min.  8.  Prolonged REM sleep onset latency at 184 min.  9.  Total awakenings were 9.   DIAGNOSIS:  Normal study with no significant sleep disordered breathing.  RECOMMENDATIONS:   1. Normal study with no significant sleep disordered breathing.  2.  Healthy sleep recommendations include:  adequate nightly sleep (normal 7-9 hrs/night), avoidance of caffeine after noon and alcohol near bedtime, and maintaining a sleep environment that is cool, dark and quiet.  3.  Weight loss for overweight patients is recommended.    4.  Snoring recommendations include:  weight loss where appropriate, side sleeping, and avoidance of alcohol before bed.  5.  Operation of motor vehicle or dangerous equipment must be avoided when feeling  drowsy, excessively sleepy, or mentally fatigued.    6.  An ENT consultation which may be useful for specific causes of and possible treatment of bothersome snoring.   7. Weight loss may be of benefit in reducing the severity of snoring. 3 Signature:   Armanda Magic, MD; [facility]; Diplomat, American Board of Sleep Medicine Electronically Signed: 03/01/22 Rev.   Printed on:03/01/22 02/28/22,[id number],06/28/2055,Male 89 Page 2 of 4

## 2022-03-01 NOTE — Telephone Encounter (Signed)
Prior Authorization for CPAP titration sent to [facility] via web portal. [place] Auth # [id number]. Valid dates 03/20/22 to 06/18/22.

## 2022-03-01 NOTE — Telephone Encounter (Signed)
-----   Message from Quintella Reichert, MD sent at 03/01/2022  8:50 AM EDT ----- Normal home sleep study so in lab PSG will be ordered

## 2022-03-15 ENCOUNTER — Other Ambulatory Visit: Payer: Self-pay

## 2022-03-15 MED ORDER — METOPROLOL TARTRATE 25 MG PO TABS: 25.0000 mg | ORAL_TABLET | Freq: Two times a day (BID) | ORAL | 6 refills | Status: DC

## 2022-03-17 ENCOUNTER — Other Ambulatory Visit: Payer: Self-pay | Admitting: Cardiovascular Disease

## 2022-03-17 ENCOUNTER — Telehealth: Payer: Self-pay | Admitting: Cardiovascular Disease

## 2022-03-17 MED ORDER — METOPROLOL TARTRATE 25 MG PO TABS: 25.0000 mg | ORAL_TABLET | Freq: Two times a day (BID) | ORAL | 1 refills | Status: DC

## 2022-03-17 MED ORDER — CLOPIDOGREL BISULFATE 75 MG PO TABS: 75.0000 mg | ORAL_TABLET | Freq: Every day | ORAL | 0 refills | Status: DC

## 2022-03-17 MED ORDER — APIXABAN 5 MG PO TABS: 5.0000 mg | ORAL_TABLET | Freq: Two times a day (BID) | ORAL | 1 refills | Status: DC

## 2022-03-17 NOTE — Telephone Encounter (Signed)
The patient wanted to make sure that Plavix was sent in as well.

## 2022-03-17 NOTE — Telephone Encounter (Signed)
Pt calls today with issues getting his medication. Pt states that he submitted requests for refills of both Eliquis and Metoprolol to be sent to [place] [facility] about 2 weeks ago. Pt states that he was following up with [facility] and they told him that they had already sent out his metoprolol but they had not received confirmation from our office to fill eliquis. Prescription for eliquis sent to [place] and samplers provided to that pt does not miss a dose. 30 day supply of metoprolol sent to local pharmacy also so that he does not miss a dose. Pt verbalizes understand and is grateful.     Medication samples have been provided to the patient.  Drug name: Eliquis 5mg  Qty: 2 boxes LOT: [id number] Exp.Date: 09/2023  Samples left at front desk for patient pick-up. Patient notified.

## 2022-03-17 NOTE — Telephone Encounter (Signed)
Patient calling back. He states he needs to speak with [place] before she leaves to make sure the right medications are ordered.

## 2022-03-17 NOTE — Telephone Encounter (Signed)
Pt c/o medication issue:  1. Name of Medication:  apixaban (ELIQUIS) 5 MG TABS tablet clopidogrel (PLAVIX) 75 MG tablet  2. How are you currently taking this medication (dosage and times per day)?   3. Are you having a reaction (difficulty breathing--STAT)?   4. What is your medication issue?   Patient needs refills of Eliquis and Clopidogrel. He states he has about 1 week remaining of Clopidogrel, but will be completely out of Eliquis tomorrow morning, 9/23. He worries he may not survive without this medication. He also mentions he will be out of Metoprolol on tomorrow, but Rx is currently in route from [facility]--he wants to discuss the amount of time he can go without taking the Eliquis and Metoprolol. If he will not be alright through the weekend without the medication he would like to have a temporary supply sent to a local pharmacy to last him until potential mail order arrives--he requested a call back to discuss this first.

## 2022-03-17 NOTE — Addendum Note (Signed)
Addended by: Ricci Barker on: 03/17/2022 05:11 PM   Modules accepted: Orders

## 2022-03-28 ENCOUNTER — Ambulatory Visit (HOSPITAL_BASED_OUTPATIENT_CLINIC_OR_DEPARTMENT_OTHER): Payer: Medicare PPO | Attending: Cardiology | Admitting: Cardiology

## 2022-03-28 DIAGNOSIS — G4736 Sleep related hypoventilation in conditions classified elsewhere: Secondary | ICD-10-CM | POA: Diagnosis not present

## 2022-03-28 DIAGNOSIS — R0683 Snoring: Secondary | ICD-10-CM | POA: Insufficient documentation

## 2022-03-28 DIAGNOSIS — I493 Ventricular premature depolarization: Secondary | ICD-10-CM | POA: Diagnosis not present

## 2022-03-29 ENCOUNTER — Telehealth: Payer: Self-pay | Admitting: *Deleted

## 2022-03-29 NOTE — Procedures (Signed)
   Patient Name: Harnisch, Shakir Petrosino Date:03/28/2022 Gender: Male D.O.B: 12/10/1955 Age (years): 45 Referring Provider: Fransico Him MD, ABSM Height (inches): 72 Interpreting Physician: Fransico Him MD, ABSM Weight (lbs): 208 RPSGT: Laren Everts BMI: 28 MRN: [id number] Neck Size: 17.00  CLINICAL INFORMATION Sleep Study Type: NPSG  Indication for sleep study: Daytime Fatigue, Snoring  Epworth Sleepiness Score: 4  Most recent polysomnogram dated 02/28/2022 revealed an AHI of 4/h.  SLEEP STUDY TECHNIQUE As per the AASM Manual for the Scoring of Sleep and Associated Events v2.3 (April 2016) with a hypopnea requiring 4% desaturations.  The channels recorded and monitored were frontal, central and occipital EEG, electrooculogram (EOG), submentalis EMG (chin), nasal and oral airflow, thoracic and abdominal wall motion, anterior tibialis EMG, snore microphone, electrocardiogram, and pulse oximetry.  MEDICATIONS Medications self-administered by patient taken the night of the study : N/A  SLEEP ARCHITECTURE The study was initiated at 10:34:49 PM and ended at 5:18:17 AM.  Sleep onset time was 34.4 minutes and the sleep efficiency was 72.0%. The total sleep time was 290.5 minutes.  Stage REM latency was 74.0 minutes.  The patient spent 10.2% of the night in stage N1 sleep, 68.8% in stage N2 sleep, 0.0% in stage N3 and 21% in REM.  Alpha intrusion was absent.  Supine sleep was 8.61%.  RESPIRATORY PARAMETERS The overall apnea/hypopnea index (AHI) was 3.7 per hour. There were 16 total apneas, including 2 obstructive, 12 central and 2 mixed apneas. There were 2 hypopneas and 32 RERAs.  The AHI during Stage REM sleep was 3.0 per hour.  AHI while supine was 36.0 per hour.  The mean oxygen saturation was 92.5%. The minimum SpO2 during sleep was 88.0%.  moderate snoring was noted during this study.  CARDIAC DATA The 2 lead EKG demonstrated sinus rhythm. The mean heart rate was  50.7 beats per minute. Other EKG findings include: PVCs.  LEG MOVEMENT DATA The total PLMS were 0 with a resulting PLMS index of 0.0. Associated arousal with leg movement index was 0.0 .  IMPRESSIONS - No significant obstructive sleep apnea occurred during this study (AHI = 3.7/h). - No significant central sleep apnea occurred during this study (CAI = 2.5/h). - The patient had minimal or no oxygen desaturation during the study (Min O2 = 88.0%) - The patient snored with moderate snoring volume. - EKG findings include PVCs. - Clinically significant periodic limb movements did not occur during sleep. No significant associated arousals.  DIAGNOSIS - Normal Study  RECOMMENDATIONS - Positional therapy avoiding supine position during sleep. - Avoid alcohol, sedatives and other CNS depressants that may worsen sleep apnea and disrupt normal sleep architecture. - Sleep hygiene should be reviewed to assess factors that may improve sleep quality. - Weight management and regular exercise should be initiated or continued if appropriate.  [Electronically signed] 03/29/2022 03:18 PM  Fransico Him MD, ABSM Diplomate, American Board of Sleep Medicine

## 2022-03-29 NOTE — Telephone Encounter (Signed)
The patient has been notified of the result and verbalized understanding.  All questions (if any) were answered. Marolyn Hammock, [place] 03/29/2022 6:35 PM    Pt is aware and agreeable to normal results.

## 2022-03-29 NOTE — Telephone Encounter (Signed)
-----   Message from Sueanne Margarita, MD sent at 03/29/2022  3:19 PM EDT ----- Please let patient know that sleep study showed no significant sleep apnea.

## 2022-04-03 ENCOUNTER — Encounter: Payer: Self-pay | Admitting: Cardiovascular Disease

## 2022-05-04 DIAGNOSIS — I25118 Atherosclerotic heart disease of native coronary artery with other forms of angina pectoris: Secondary | ICD-10-CM | POA: Diagnosis not present

## 2022-05-04 DIAGNOSIS — E782 Mixed hyperlipidemia: Secondary | ICD-10-CM | POA: Diagnosis not present

## 2022-05-05 LAB — COMPREHENSIVE METABOLIC PANEL
ALT: 27 IU/L (ref 0–44)
AST: 21 IU/L (ref 0–40)
Albumin/Globulin Ratio: 2 (ref 1.2–2.2)
Albumin: 4.2 g/dL (ref 3.9–4.9)
Alkaline Phosphatase: 78 IU/L (ref 44–121)
BUN/Creatinine Ratio: 14 (ref 10–24)
BUN: 14 mg/dL (ref 8–27)
Bilirubin Total: 1.1 mg/dL (ref 0.0–1.2)
CO2: 26 mmol/L (ref 20–29)
Calcium: 9 mg/dL (ref 8.6–10.2)
Chloride: 102 mmol/L (ref 96–106)
Creatinine, Ser: 1.01 mg/dL (ref 0.76–1.27)
Globulin, Total: 2.1 g/dL (ref 1.5–4.5)
Glucose: 104 mg/dL — ABNORMAL HIGH (ref 70–99)
Potassium: 4 mmol/L (ref 3.5–5.2)
Sodium: 139 mmol/L (ref 134–144)
Total Protein: 6.3 g/dL (ref 6.0–8.5)
eGFR: 82 mL/min/{1.73_m2} (ref >59)

## 2022-05-05 LAB — LIPID PANEL
Chol/HDL Ratio: 2.9 ratio (ref 0.0–5.0)
Cholesterol, Total: 87 mg/dL — ABNORMAL LOW (ref 100–199)
HDL: 30 mg/dL — ABNORMAL LOW (ref >39)
LDL Chol Calc (NIH): 43 mg/dL (ref 0–99)
Triglycerides: 62 mg/dL (ref 0–149)
VLDL Cholesterol Cal: 14 mg/dL (ref 5–40)

## 2022-05-05 LAB — CBC
Hematocrit: 44 % (ref 37.5–51.0)
Hemoglobin: 15 g/dL (ref 13.0–17.7)
MCH: 30.1 pg (ref 26.6–33.0)
MCHC: 34.1 g/dL (ref 31.5–35.7)
MCV: 88 fL (ref 79–97)
Platelets: 242 10*3/uL (ref 150–450)
RBC: 4.98 x10E6/uL (ref 4.14–5.80)
RDW: 12.2 % (ref 11.6–15.4)
WBC: 3.9 10*3/uL (ref 3.4–10.8)

## 2022-05-07 NOTE — Progress Notes (Unsigned)
Cardiology Office Note:   Date:  05/09/2022  NAME:  Menachem Knoop    MRN: [id number] DOB:  1955/12/06   PCP:  Sigmund Hazel, MD  Cardiologist:  Reatha Harps, MD  Electrophysiologist:  None   Referring MD: Sigmund Hazel, MD   Chief Complaint  Patient presents with   Follow-up        History of Present Illness:   Ashaun Seeling is a 66 y.o. male with a hx of CAD status post PCI, paroxysmal atrial fibrillation who presents for follow-up.  Denies chest pain or trouble breathing.  He is exercising daily.  No issues.  No bleeding on Eliquis.  Blood pressure is well controlled.  We discussed stopping Plavix in December.  He will transition to aspirin at that point.  On Eliquis.  No bleeding issues.  Blood pressure is well controlled.   Problem List HTN HLD -T chol 87, HDL 30, LDL 43, TG 62 3. CAD -calcium score 992 (91st percentile) -PCI prox RCA 12/19/2021 -95% D1 -> med management  -LP(a) 177 4. Ascending aortic aneurysm 43 mm 5. Paroxysmal Afib -02/14/2022 -CHADSVASC=3 (age, CAD, HTN)  Past Medical History: Past Medical History:  Diagnosis Date   Coronary artery disease    Hypertension     Past Surgical History: Past Surgical History:  Procedure Laterality Date   CORONARY STENT INTERVENTION N/A 12/20/2021   Procedure: CORONARY STENT INTERVENTION;  Surgeon: Marykay Lex, MD;  Location: [facility] INVASIVE CV LAB;  Service: Cardiovascular;  Laterality: N/A;   FETAL SURGERY FOR CONGENITAL HERNIA     INTRAVASCULAR PRESSURE WIRE/FFR STUDY N/A 12/20/2021   Procedure: INTRAVASCULAR PRESSURE WIRE/FFR STUDY;  Surgeon: Marykay Lex, MD;  Location: [facility] INVASIVE CV LAB;  Service: Cardiovascular;  Laterality: N/A;   LEFT HEART CATH AND CORONARY ANGIOGRAPHY N/A 12/20/2021   Procedure: LEFT HEART CATH AND CORONARY ANGIOGRAPHY;  Surgeon: Marykay Lex, MD;  Location: [facility] INVASIVE CV LAB;  Service: Cardiovascular;  Laterality: N/A;    Current Medications: Current Meds  Medication Sig    acetaminophen (TYLENOL) 500 MG tablet Take 500 mg by mouth every 6 (six) hours as needed.   apixaban (ELIQUIS) 5 MG TABS tablet Take 1 tablet (5 mg total) by mouth every 12 (twelve) hours.   atorvastatin (LIPITOR) 80 MG tablet Take 1 tablet (80 mg total) by mouth daily.   clopidogrel (PLAVIX) 75 MG tablet Take 1 tablet (75 mg total) by mouth daily with breakfast.   doxylamine, Sleep, (SLEEP AID) 25 MG tablet Take 25 mg by mouth at bedtime as needed for sleep. [place]   losartan (COZAAR) 25 MG tablet Take 1 tablet (25 mg total) by mouth daily.   metoprolol tartrate (LOPRESSOR) 25 MG tablet Take 1 tablet (25 mg total) by mouth 2 (two) times daily.   nitroGLYCERIN (NITROSTAT) 0.4 MG SL tablet Place 1 tablet (0.4 mg total) under the tongue every 5 (five) minutes as needed for chest pain.   SUPER B COMPLEX/C PO Take 1 tablet by mouth every morning.   Turmeric (QC TUMERIC COMPLEX PO) With black pepper     Allergies:    Patient has no allergy information on record.   Social History: Social History   Socioeconomic History   Marital status: Married    Spouse name: Not on file   Number of children: 2   Years of education: Not on file   Highest education level: Not on file  Occupational History   Occupation: Business analysis  Tobacco Use  Smoking status: Never   Smokeless tobacco: Never  Substance and Sexual Activity   Alcohol use: Not Currently   Drug use: Never   Sexual activity: Not on file  Other Topics Concern   Not on file  Social History Narrative   Not on file   Social Determinants of Health   Financial Resource Strain: Not on file  Food Insecurity: Not on file  Transportation Needs: Not on file  Physical Activity: Not on file  Stress: Not on file  Social Connections: Not on file     Family History: The patient's family history includes Heart disease in his father.  ROS:   All other ROS reviewed and negative. Pertinent positives noted in the HPI.      EKGs/Labs/Other Studies Reviewed:   The following studies were personally reviewed by me today:  TTE 12/06/2021 1. Left ventricular ejection fraction, by estimation, is 55 to 60%. The  left ventricle has normal function. The left ventricle has no regional  wall motion abnormalities. There is mild left ventricular hypertrophy of  the basal-septal segment. Left  ventricular diastolic parameters were normal.   2. Right ventricular systolic function is normal. The right ventricular  size is normal. There is normal pulmonary artery systolic pressure.   3. The mitral valve is abnormal. Mild mitral valve regurgitation. No  evidence of mitral stenosis. 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   4. The aortic valve is grossly normal. There is mild calcification of the  aortic valve. Aortic valve regurgitation is not visualized. No aortic  stenosis is present.   5. Aortic dilatation noted. There is mild dilatation of the ascending  aorta, measuring 41 mm.   6. The inferior vena cava is normal in size with greater than 50%  respiratory variability, suggesting right atrial pressure of 3 mmHg.    Recent Labs: 02/14/2022: Magnesium 1.9; TSH 1.863 05/04/2022: ALT 27; BUN 14; Creatinine, Ser 1.01; Hemoglobin 15.0; Platelets 242; Potassium 4.0; Sodium 139   Recent Lipid Panel    Component Value Date/Time   CHOL 87 (L) 05/04/2022 0859   TRIG 62 05/04/2022 0859   HDL 30 (L) 05/04/2022 0859   CHOLHDL 2.9 05/04/2022 0859   LDLCALC 43 05/04/2022 0859    Physical Exam:   VS:  BP 122/76   Pulse 63   Ht 6' (1.829 m)   Wt 215 lb 9.6 oz (97.8 kg)   SpO2 96%   BMI 29.24 kg/m    Wt Readings from Last 3 Encounters:  05/09/22 215 lb 9.6 oz (97.8 kg)  03/28/22 208 lb (94.3 kg)  02/28/22 213 lb 6.4 oz (96.8 kg)    General: Well nourished, well developed, in no acute distress Head: Atraumatic, normal size  Eyes: PEERLA, EOMI  Neck:  Supple, no JVD Endocrine: No thryomegaly Cardiac: Normal S1, S2; RRR; no murmurs, rubs, or gallops Lungs: Clear to auscultation bilaterally, no wheezing, rhonchi or rales  Abd: Soft, nontender, no hepatomegaly  Ext: No edema, pulses 2+ Musculoskeletal: No deformities, BUE and BLE strength normal and equal Skin: Warm and dry, no rashes   Neuro: Alert and oriented to person, place, time, and situation, CNII-XII grossly intact, no focal deficits  Psych: Normal mood and affect   ASSESSMENT:   Arlester Chaney is a 66 y.o. male who presents for the following: 1. Paroxysmal atrial fibrillation (HCC)   2. Coronary artery disease involving native coronary artery of native heart without  angina pectoris   3. Hyperlipidemia LDL goal <70   4. Essential hypertension     PLAN:   1. Paroxysmal atrial fibrillation (HCC) -No further recurrence of A-fib.  Continue metoprolol.  On Eliquis 5 mg twice daily.  CHA2DS2-VASc is 3.  He will monitor with the Apple watch.  2. Coronary artery disease involving native coronary artery of native heart without angina pectoris 3. Hyperlipidemia LDL goal <70 -Severe stenosis in the RCA status post PCI.  First diagonal with 90% lesion.  This will be managed medically.  No symptoms of angina.  Stent to the RCA was placed in June 2023.  He will continue Plavix and Eliquis until December 26.  At that time he will stop Plavix and start aspirin 81 mg daily.  No symptoms of angina.  LDL well controlled.  Continue Lipitor 80 mg daily.  Continue beta-blocker.  Blood pressure well controlled.  4. Essential hypertension -Well-controlled.  No change to medications.  Disposition: Return in about 6 months (around 11/07/2022).  Medication Adjustments/Labs and Tests Ordered: Current medicines are reviewed at length with the patient today.  Concerns regarding medicines are outlined above.  No orders of the defined types were placed in this encounter.  No orders of the defined types  were placed in this encounter.   Patient Instructions  Medication Instructions:  STOP Plavix (December 26th) then START Aspirin 81 mg daily  *If you need a refill on your cardiac medications before your next appointment, please call your pharmacy*   Follow-Up: At [facility], you and your health needs are our priority.  As part of our continuing mission to provide you with exceptional heart care, we have created designated Provider Care Teams.  These Care Teams include your primary Cardiologist (physician) and Advanced Practice Providers (APPs -  Physician Assistants and Nurse Practitioners) who all work together to provide you with the care you need, when you need it.  We recommend signing up for the patient portal called "MyChart".  Sign up information is provided on this After Visit Summary.  MyChart is used to connect with patients for Virtual Visits (Telemedicine).  Patients are able to view lab/test results, encounter notes, upcoming appointments, etc.  Non-urgent messages can be sent to your provider as well.   To learn more about what you can do with MyChart, go to [email or web address].    Your next appointment:   6 month(s)  The format for your next appointment:   In Person  Provider:   Evalina Field, MD            Time Spent with Patient: I have spent a total of 35 minutes with patient reviewing hospital notes, telemetry, EKGs, labs and examining the patient as well as establishing an assessment and plan that was discussed with the patient.  > 50% of time was spent in direct patient care.  Signed, Addison Naegeli. Audie Box, MD, [place]  [address], [place], [place] [address] [phone]  05/09/2022 10:11 AM

## 2022-05-09 ENCOUNTER — Encounter: Payer: Self-pay | Admitting: Cardiovascular Disease

## 2022-05-09 ENCOUNTER — Ambulatory Visit: Payer: Medicare PPO | Attending: Cardiovascular Disease | Admitting: Cardiovascular Disease

## 2022-05-09 VITALS — BP 122/76 | HR 63 | Ht 72.0 in | Wt 215.6 lb

## 2022-05-09 DIAGNOSIS — I10 Essential (primary) hypertension: Secondary | ICD-10-CM

## 2022-05-09 DIAGNOSIS — I2510 Atherosclerotic heart disease of native coronary artery without angina pectoris: Secondary | ICD-10-CM | POA: Diagnosis not present

## 2022-05-09 DIAGNOSIS — I480 Paroxysmal atrial fibrillation: Secondary | ICD-10-CM | POA: Diagnosis not present

## 2022-05-09 DIAGNOSIS — E785 Hyperlipidemia, unspecified: Secondary | ICD-10-CM | POA: Diagnosis not present

## 2022-05-09 NOTE — Patient Instructions (Signed)
Medication Instructions:  STOP Plavix (December 26th) then START Aspirin 81 mg daily  *If you need a refill on your cardiac medications before your next appointment, please call your pharmacy*   Follow-Up: At [facility], you and your health needs are our priority.  As part of our continuing mission to provide you with exceptional heart care, we have created designated Provider Care Teams.  These Care Teams include your primary Cardiologist (physician) and Advanced Practice Providers (APPs -  Physician Assistants and Nurse Practitioners) who all work together to provide you with the care you need, when you need it.  We recommend signing up for the patient portal called "MyChart".  Sign up information is provided on this After Visit Summary.  MyChart is used to connect with patients for Virtual Visits (Telemedicine).  Patients are able to view lab/test results, encounter notes, upcoming appointments, etc.  Non-urgent messages can be sent to your provider as well.   To learn more about what you can do with MyChart, go to [email or web address].    Your next appointment:   6 month(s)  The format for your next appointment:   In Person  Provider:   Reatha Harps, MD

## 2022-05-10 ENCOUNTER — Telehealth: Payer: Self-pay

## 2022-05-10 NOTE — Telephone Encounter (Addendum)
Called patient regarding results. Left detailed message or patient . Letter mailed to patient on 05/10/2022----- Message from Jodelle Gross, NP sent at 05/05/2022  7:51 AM EST ----- Cholesterol is VERY WELL controlled on atorvastatin dose. Liver function is normal. Continue the current dose.  Good report

## 2022-05-28 ENCOUNTER — Other Ambulatory Visit: Payer: Self-pay | Admitting: Cardiovascular Disease

## 2022-08-10 ENCOUNTER — Other Ambulatory Visit: Payer: Self-pay | Admitting: Cardiovascular Disease

## 2022-08-10 ENCOUNTER — Other Ambulatory Visit: Payer: Self-pay | Admitting: Nurse Practitioner

## 2022-08-10 NOTE — Telephone Encounter (Signed)
Prescription refill request for Eliquis received. Indication: PAF Last office visit:  05/09/22  Dorann Lodge MD Scr: 1.01 on 05/04/22 Age: 67 Weight: 97.8kg  Based on above findings Eliquis 62m twice daily is the appropriate dose.  Refill approved.

## 2022-08-21 NOTE — Progress Notes (Unsigned)
Cardiology Office Note:   Date:  08/23/2022  NAME:  Kevin Larsen    MRN: [id number] DOB:  1956-04-16   PCP:  Kathyrn Lass, MD  Cardiologist:  Evalina Field, MD  Electrophysiologist:  None   Referring MD: Kathyrn Lass, MD   Chief Complaint  Patient presents with   Follow-up        History of Present Illness:   Kevin Larsen is a 67 y.o. male with a hx of CAD, pAF, HTN, HLD who presents for follow-up.  He reports he is doing well.  He is walking 3-4 times per week.  5+ miles per session.  No chest pain or trouble breathing.  Blood pressure is 126/80.  Denies any symptoms of angina.  Heart rate 65.  He has switched to aspirin.  Reports no significant bruising or bleeding.  Getting his medications.  Lipids are well-controlled.  CV exam unchanged.  He does have dilated aorta up to 40 mm on contrast study.  We will plan for repeat chest CT in 1 year.  Problem List HTN HLD -T chol 87, HDL 30, LDL 43, TG 62 3. CAD -calcium score 992 (91st percentile) -PCI prox RCA 12/19/2021 -95% D1 -> med management  -LP(a) 177  4. Ascending aortic aneurysm 43 mm 5. Paroxysmal Afib -02/14/2022 -CHADSVASC=3 (age, CAD, HTN)  Past Medical History: Past Medical History:  Diagnosis Date   Coronary artery disease    Hypertension     Past Surgical History: Past Surgical History:  Procedure Laterality Date   CORONARY STENT INTERVENTION N/A 12/20/2021   Procedure: CORONARY STENT INTERVENTION;  Surgeon: Leonie Man, MD;  Location: [place] CV LAB;  Service: Cardiovascular;  Laterality: N/A;   FETAL SURGERY FOR CONGENITAL HERNIA     INTRAVASCULAR PRESSURE WIRE/FFR STUDY N/A 12/20/2021   Procedure: INTRAVASCULAR PRESSURE WIRE/FFR STUDY;  Surgeon: Leonie Man, MD;  Location: [place] CV LAB;  Service: Cardiovascular;  Laterality: N/A;   LEFT HEART CATH AND CORONARY ANGIOGRAPHY N/A 12/20/2021   Procedure: LEFT HEART CATH AND CORONARY ANGIOGRAPHY;  Surgeon: Leonie Man, MD;  Location: [place] CV LAB;  Service: Cardiovascular;  Laterality: N/A;    Current Medications: Current Meds  Medication Sig   apixaban (ELIQUIS) 5 MG TABS tablet TAKE 1 TABLET EVERY 12 HOURS   aspirin EC 81 MG tablet Take 81 mg by mouth daily. Swallow whole.   atorvastatin (LIPITOR) 80 MG tablet Take 1 tablet (80 mg total) by mouth daily.   doxylamine, Sleep, (SLEEP AID) 25 MG tablet Take 25 mg by mouth at bedtime as needed for sleep. [place]   losartan (COZAAR) 25 MG tablet Take 1 tablet (25 mg total) by mouth daily.   metoprolol tartrate (LOPRESSOR) 25 MG tablet TAKE 1 TABLET TWICE DAILY   SUPER B COMPLEX/C PO Take 1 tablet by mouth every morning.   Turmeric (QC TUMERIC COMPLEX PO) With black pepper     Allergies:    Patient has no allergy information on record.   Social History: Social History   Socioeconomic History   Marital status: Married    Spouse name: Not on file   Number of children: 2   Years of education: Not on file   Highest education level: Not on file  Occupational History   Occupation: Business analysis  Tobacco Use   Smoking status: Never   Smokeless tobacco: Never  Substance and Sexual Activity   Alcohol use: Not Currently   Drug use: Never   Sexual  activity: Not on file  Other Topics Concern   Not on file  Social History Narrative   Not on file   Social Determinants of Health   Financial Resource Strain: Not on file  Food Insecurity: Not on file  Transportation Needs: Not on file  Physical Activity: Not on file  Stress: Not on file  Social Connections: Not on file     Family History: The patient's family history includes Heart disease in his father.  ROS:   All other ROS reviewed and negative. Pertinent positives noted in the HPI.     EKGs/Labs/Other Studies Reviewed:   The following studies were personally reviewed by me today:   TTE 12/06/2021  1. Left ventricular ejection fraction, by estimation, is 55 to 60%. The  left ventricle has  normal function. The left ventricle has no regional  wall motion abnormalities. There is mild left ventricular hypertrophy of  the basal-septal segment. Left  ventricular diastolic parameters were normal.   2. Right ventricular systolic function is normal. The right ventricular  size is normal. There is normal pulmonary artery systolic pressure.   3. The mitral valve is abnormal. Mild mitral valve regurgitation. No  evidence of mitral stenosis. 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   4. The aortic valve is grossly normal. There is mild calcification of the  aortic valve. Aortic valve regurgitation is not visualized. No aortic  stenosis is present.   5. Aortic dilatation noted. There is mild dilatation of the ascending  aorta, measuring 41 mm.   6. The inferior vena cava is normal in size with greater than 50%  respiratory variability, suggesting right atrial pressure of 3 mmHg.   Recent Labs: 02/14/2022: Magnesium 1.9; TSH 1.863 05/04/2022: ALT 27; BUN 14; Creatinine, Ser 1.01; Hemoglobin 15.0; Platelets 242; Potassium 4.0; Sodium 139   Recent Lipid Panel    Component Value Date/Time   CHOL 87 (L) 05/04/2022 0859   TRIG 62 05/04/2022 0859   HDL 30 (L) 05/04/2022 0859   CHOLHDL 2.9 05/04/2022 0859   LDLCALC 43 05/04/2022 0859    Physical Exam:   VS:  BP 126/80   Pulse 65   Ht 6' (1.829 m)   Wt 224 lb 3.2 oz (101.7 kg)   SpO2 94%   BMI 30.41 kg/m    Wt Readings from Last 3 Encounters:  08/23/22 224 lb 3.2 oz (101.7 kg)  05/09/22 215 lb 9.6 oz (97.8 kg)  03/28/22 208 lb (94.3 kg)    General: Well nourished, well developed, in no acute distress Head: Atraumatic, normal size  Eyes: PEERLA, EOMI  Neck: Supple, no JVD Endocrine: No thryomegaly Cardiac: Normal S1, S2; RRR; no murmurs, rubs, or gallops Lungs: Clear to auscultation bilaterally, no wheezing, rhonchi or rales  Abd: Soft, nontender, no hepatomegaly   Ext: No edema, pulses 2+ Musculoskeletal: No deformities, BUE and BLE strength normal and equal Skin: Warm and dry, no rashes   Neuro: Alert and oriented to person, place, time, and situation, CNII-XII grossly intact, no focal deficits  Psych: Normal mood and affect   ASSESSMENT:   Kevin Larsen is a 67 y.o. male who presents for the following: 1. Paroxysmal atrial fibrillation (HCC)   2. Coronary artery disease involving native coronary artery of native heart without angina pectoris   3. Hyperlipidemia LDL goal <70   4. Essential hypertension   5. Aneurysm of ascending aorta without rupture (HCC)  PLAN:   1. Paroxysmal atrial fibrillation (HCC) -No recurrence.  Continue metoprolol tartrate 25 mg twice daily.  CHA2DS2-VASc equals 2.  Continue Eliquis 5 mg twice daily.  2. Coronary artery disease involving native coronary artery of native heart without angina pectoris 3. Hyperlipidemia LDL goal <70 -Status post PCI to the RCA.  Residual diagonal disease.  No symptoms of angina.  Continue aspirin and Eliquis.  On Lipitor.  LDL at goal.  On metoprolol.  BP well-controlled.  Echo shows normal LV function.  4. Essential hypertension -Well-controlled.  No change.  5. Aneurysm of ascending aorta without rupture (HCC) -40 mm noncontrast CT.  43 mm on noncontrast CT.  Echo 41 mm.  Repeat chest CT noncontrast 1 year.  Suspect this is just Tabb to upper limits of normal for his age.      Disposition: Return in about 1 year (around 08/24/2023).  Medication Adjustments/Labs and Tests Ordered: Current medicines are reviewed at length with the patient today.  Concerns regarding medicines are outlined above.  Orders Placed This Encounter  Procedures   CT Chest Wo Contrast   No orders of the defined types were placed in this encounter.   Patient Instructions  Medication Instructions:  The current medical regimen is effective;  continue present plan and medications.  *If you need a  refill on your cardiac medications before your next appointment, please call your pharmacy*  Testing/Procedures:  Non Contrast Chest CT in 12 months before follow up visit.    Follow-Up: At [facility], you and your health needs are our priority.  As part of our continuing mission to provide you with exceptional heart care, we have created designated Provider Care Teams.  These Care Teams include your primary Cardiologist (physician) and Advanced Practice Providers (APPs -  Physician Assistants and Nurse Practitioners) who all work together to provide you with the care you need, when you need it.  We recommend signing up for the patient portal called "MyChart".  Sign up information is provided on this After Visit Summary.  MyChart is used to connect with patients for Virtual Visits (Telemedicine).  Patients are able to view lab/test results, encounter notes, upcoming appointments, etc.  Non-urgent messages can be sent to your provider as well.   To learn more about what you can do with MyChart, go to [email or web address].    Your next appointment:   12 month(s)  Provider:   Evalina Field, MD      Time Spent with Patient: I have spent a total of 25 minutes with patient reviewing hospital notes, telemetry, EKGs, labs and examining the patient as well as establishing an assessment and plan that was discussed with the patient.  > 50% of time was spent in direct patient care.  Signed, Addison Naegeli. Audie Box, MD, [place]  [address] [place], [place] [address] [phone]  08/23/2022 8:35 AM

## 2022-08-23 ENCOUNTER — Encounter: Payer: Self-pay | Admitting: Cardiovascular Disease

## 2022-08-23 ENCOUNTER — Ambulatory Visit: Payer: Medicare PPO | Attending: Cardiovascular Disease | Admitting: Cardiovascular Disease

## 2022-08-23 VITALS — BP 126/80 | HR 65 | Ht 72.0 in | Wt 224.2 lb

## 2022-08-23 DIAGNOSIS — I7121 Aneurysm of the ascending aorta, without rupture: Secondary | ICD-10-CM | POA: Diagnosis not present

## 2022-08-23 DIAGNOSIS — I480 Paroxysmal atrial fibrillation: Secondary | ICD-10-CM

## 2022-08-23 DIAGNOSIS — I2510 Atherosclerotic heart disease of native coronary artery without angina pectoris: Secondary | ICD-10-CM

## 2022-08-23 DIAGNOSIS — E785 Hyperlipidemia, unspecified: Secondary | ICD-10-CM

## 2022-08-23 DIAGNOSIS — I10 Essential (primary) hypertension: Secondary | ICD-10-CM | POA: Diagnosis not present

## 2022-08-23 NOTE — Patient Instructions (Addendum)
Medication Instructions:  The current medical regimen is effective;  continue present plan and medications.  *If you need a refill on your cardiac medications before your next appointment, please call your pharmacy*  Testing/Procedures:  Non Contrast Chest CT in 12 months before follow up visit.    Follow-Up: At [facility], you and your health needs are our priority.  As part of our continuing mission to provide you with exceptional heart care, we have created designated Provider Care Teams.  These Care Teams include your primary Cardiologist (physician) and Advanced Practice Providers (APPs -  Physician Assistants and Nurse Practitioners) who all work together to provide you with the care you need, when you need it.  We recommend signing up for the patient portal called "MyChart".  Sign up information is provided on this After Visit Summary.  MyChart is used to connect with patients for Virtual Visits (Telemedicine).  Patients are able to view lab/test results, encounter notes, upcoming appointments, etc.  Non-urgent messages can be sent to your provider as well.   To learn more about what you can do with MyChart, go to [email or web address].    Your next appointment:   12 month(s)  Provider:   Evalina Field, MD

## 2022-09-06 ENCOUNTER — Encounter: Payer: Self-pay | Admitting: Cardiovascular Disease

## 2022-09-25 MED ORDER — ATORVASTATIN CALCIUM 80 MG PO TABS: 80.0000 mg | ORAL_TABLET | Freq: Every day | ORAL | 3 refills | Status: DC

## 2022-11-09 ENCOUNTER — Telehealth: Payer: Self-pay

## 2022-11-09 DIAGNOSIS — Z7901 Long term (current) use of anticoagulants: Secondary | ICD-10-CM | POA: Diagnosis not present

## 2022-11-09 DIAGNOSIS — Z86010 Personal history of colonic polyps: Secondary | ICD-10-CM | POA: Diagnosis not present

## 2022-11-09 NOTE — Telephone Encounter (Signed)
   Pre-operative Risk Assessment    Patient Name: Kevin Larsen  DOB: 01/01/1956 MRN: [id number]        Request for Surgical Clearance    Procedure:   Colonoscopy  Date of Surgery:  Clearance  12/22/22                                Surgeon:  Dr. Marca Ancona  Surgeon's Group or Practice Name:  [facility]   Phone number:  [phone] Fax number:  [phone]   Type of Clearance Requested:   - Medical  - Pharmacy:  Hold Aspirin, Clopidogrel (Plavix), and Apixaban (Eliquis)     Type of Anesthesia:  Not Indicated    Additional requests/questions:   N/A  Berneda Rose   11/09/2022, 4:55 PM

## 2022-11-09 NOTE — Telephone Encounter (Signed)
ANESTHESIA WILL BE PROPOFOL

## 2022-11-10 NOTE — Telephone Encounter (Signed)
Wes, request received to hold DAPT for colonscopy. Due to RCA stent length 30 mm overlapping previously placed stent, this falls outside our protocol. Could you please comment on guidance for holding Plavix and aspirin and send your response to p cv div preop?  Thank you, Marcelino Duster

## 2022-11-13 NOTE — Telephone Encounter (Signed)
   Name: Kevin Larsen  DOB: 1956/04/03  MRN: [id number]  Primary Cardiologist: Reatha Harps, MD   Preoperative team, please contact this patient and set up a phone call appointment for further preoperative risk assessment. Please obtain consent and complete medication review. Thank you for your help.  I confirm that guidance regarding antiplatelet and oral anticoagulation therapy has been completed and, if necessary, noted below.  Per office protocol, patient can hold Eliquis for 1-2 days prior to procedure.  Per Dr. Flora Lipps, "patient should only be on Aspirin and Eliquis. He should not be on Plavix. OK to hold Aspirin before procedure and re-start after procedure at discretion of the surgeon."     Joylene Grapes, NP 11/13/2022, 3:20 PM [facility]

## 2022-11-13 NOTE — Telephone Encounter (Signed)
Called patient to get him scheduled for a telephone visit. He stated that per his las visit with Dr. Flora Lipps that he made Dr. Flora Lipps aware of the colonoscopy. Kevin Larsen stated that he was told with Dr. Flora Lipps that he is okay with the procedure and if he needed to hold or cut his Eliquis in half. Patient stated that he does not want to do a telephone visit if he has to pay a $40 copay when he was already informed that he was cleared by Dr. Flora Lipps and what he needed to do as far as the medication hold would go. Did inform patient that telephone/video visits are billed like office visits and that I would send this information back to the preop pool for review and how to go about handling this matter. He thanked me for calling and being patient with him in explaining to the best of my abilities the process.

## 2022-11-13 NOTE — Telephone Encounter (Signed)
Patient with diagnosis of PAF on Eliquis for anticoagulation.    Procedure: Colonoscopy  Date of procedure: 12/22/2022   CHA2DS2-VASc Score = 3   This indicates a 3.2% annual risk of stroke. The patient's score is based upon: CHF History: 0 HTN History: 1 Diabetes History: 0 Stroke History: 0 Vascular Disease History: 1 Age Score: 1 Gender Score: 0     CrCl 88 mL/min (05/04/2022) Platelet count 242 K (05/04/2022)    Per office protocol, patient can hold Eliquis for 1-2 days prior to procedure.     **This guidance is not considered finalized until pre-operative APP has relayed final recommendations.**

## 2022-11-13 NOTE — Telephone Encounter (Signed)
I left a message for the patient to call our office to schedule a tele visit for pre-op 

## 2022-11-14 NOTE — Addendum Note (Signed)
Addended by: Bernadene Person C on: 11/14/2022 10:47 AM   Modules accepted: Orders

## 2022-11-14 NOTE — Telephone Encounter (Signed)
   Name: Kevin Larsen  DOB: 01-07-1956  MRN: [id number]   Primary Cardiologist: Reatha Harps, MD  Chart reviewed as part of pre-operative protocol coverage. Patient was contacted 11/14/2022 in reference to pre-operative risk assessment for pending surgery as outlined below.  Tremayne Breshears was last seen on 08/23/2022 by Dr. Flora Lipps.  Since that day, Willson Batterson has done well from a cardiac standpoint.  He denies any new symptoms or concerns.  He is able to pleat greater than 4 METS without difficulty.  Therefore, based on ACC/AHA guidelines, the patient would be at acceptable risk for the planned procedure without further cardiovascular testing.   The patient was advised that if he develops new symptoms prior to surgery to contact our office to arrange for a follow-up visit, and he verbalized understanding.  Patient confirms he is no longer taking Plavix. Per office protocol, he can hold Eliquis for 1 to 2 days prior to procedure.  Additionally, he may hold aspirin for 5 to 7 days prior to procedure.  Please resume aspirin and Eliquis as soon as possible postprocedure, the discretion of the surgeon.  I will route this recommendation to the requesting party via Epic fax function and remove from pre-op pool. Please call with questions.  Joylene Grapes, NP 11/14/2022, 10:45 AM

## 2022-11-14 NOTE — Telephone Encounter (Addendum)
   Patient Name: Kevin Larsen  DOB: October 07, 1955 MRN: [id number]  Primary Cardiologist: Reatha Harps, MD  Chart reviewed as part of pre-operative protocol coverage.  Attempted to contact patient to discuss surgical clearance.  No answer.  Left voicemail for patient to return call at earliest convenience.  If patient was without any new symptoms or concerns, he may proceed with colonoscopy.  Per office protocol, he can hold Eliquis for 1 to 2 days prior to procedure.  Per Dr. Marylene Buerger prior notes, patient should no longer be on Plavix.  Okay to hold aspirin for 5 to 7 days prior to procedure and restart postprocedure at the discretion of the surgeon.   Joylene Grapes, NP 11/14/2022, 8:47 AM

## 2022-12-20 DIAGNOSIS — K5730 Diverticulosis of large intestine without perforation or abscess without bleeding: Secondary | ICD-10-CM | POA: Diagnosis not present

## 2022-12-20 DIAGNOSIS — D123 Benign neoplasm of transverse colon: Secondary | ICD-10-CM | POA: Diagnosis not present

## 2022-12-20 DIAGNOSIS — D122 Benign neoplasm of ascending colon: Secondary | ICD-10-CM | POA: Diagnosis not present

## 2022-12-20 DIAGNOSIS — Z86010 Personal history of colonic polyps: Secondary | ICD-10-CM | POA: Diagnosis not present

## 2022-12-20 DIAGNOSIS — Z09 Encounter for follow-up examination after completed treatment for conditions other than malignant neoplasm: Secondary | ICD-10-CM | POA: Diagnosis not present

## 2022-12-20 DIAGNOSIS — K648 Other hemorrhoids: Secondary | ICD-10-CM | POA: Diagnosis not present

## 2022-12-28 DIAGNOSIS — D123 Benign neoplasm of transverse colon: Secondary | ICD-10-CM | POA: Diagnosis not present

## 2023-01-02 ENCOUNTER — Other Ambulatory Visit: Payer: Self-pay | Admitting: *Deleted

## 2023-01-02 MED ORDER — ATORVASTATIN CALCIUM 80 MG PO TABS: 80.0000 mg | ORAL_TABLET | Freq: Every day | ORAL | 2 refills | Status: DC

## 2023-01-05 DIAGNOSIS — I700 Atherosclerosis of aorta: Secondary | ICD-10-CM | POA: Diagnosis not present

## 2023-01-05 DIAGNOSIS — R7303 Prediabetes: Secondary | ICD-10-CM | POA: Diagnosis not present

## 2023-01-05 DIAGNOSIS — D6869 Other thrombophilia: Secondary | ICD-10-CM | POA: Diagnosis not present

## 2023-01-05 DIAGNOSIS — I2510 Atherosclerotic heart disease of native coronary artery without angina pectoris: Secondary | ICD-10-CM | POA: Diagnosis not present

## 2023-01-05 DIAGNOSIS — Z6830 Body mass index (BMI) 30.0-30.9, adult: Secondary | ICD-10-CM | POA: Diagnosis not present

## 2023-01-05 DIAGNOSIS — Z0000 Encounter for general adult medical examination without abnormal findings: Secondary | ICD-10-CM | POA: Diagnosis not present

## 2023-01-05 DIAGNOSIS — I480 Paroxysmal atrial fibrillation: Secondary | ICD-10-CM | POA: Diagnosis not present

## 2023-01-05 DIAGNOSIS — I7121 Aneurysm of the ascending aorta, without rupture: Secondary | ICD-10-CM | POA: Diagnosis not present

## 2023-01-05 DIAGNOSIS — I10 Essential (primary) hypertension: Secondary | ICD-10-CM | POA: Diagnosis not present

## 2023-03-08 ENCOUNTER — Other Ambulatory Visit: Payer: Self-pay | Admitting: Nurse Practitioner

## 2023-03-19 IMAGING — CT CT CARDIAC CORONARY ARTERY CALCIUM SCORE
3 series · 14 of 20 positions shown, 16 images · non-contrast
Comparison: 03/16/2009 chest radiograph
COMPARISON: 03/16/2009 chest radiograph

Addendum:
CLINICAL DATA: This over-read does not include interpretation of
cardiac or coronary anatomy or pathology. The calcium score
interpretation by the cardiologist is attached.
CLINICAL DATA: Cardiovascular Disease Risk stratification

EXAM:
Coronary Calcium Score
TECHNIQUE: A gated, non-contrast computed tomography scan of the heart was
performed using 3mm slice thickness. Axial images were analyzed on a
dedicated workstation. Calcium scoring of the coronary arteries was
performed using the Agatston method.

[Series 2: cascseq 2.0 sa36 70% (id) · axial · 0.43mm/px · z∈[-303,-201]mm · 4 of 85 slices shown]
[im 17/85  vessel]
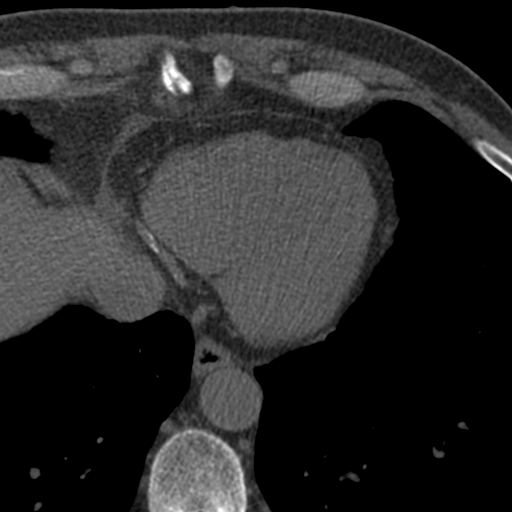
[im 34/85  vessel]
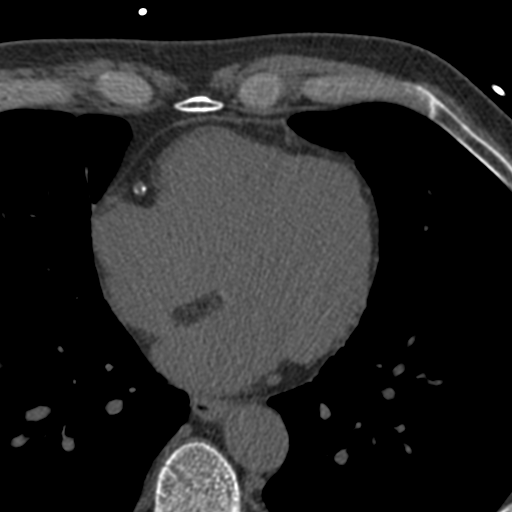
[im 51/85  vessel]
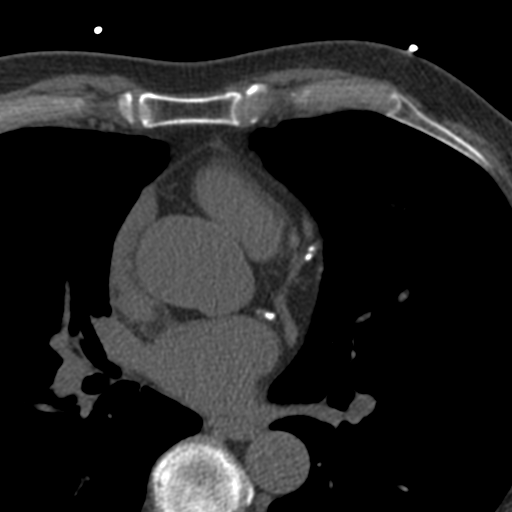
[im 68/85  vessel]
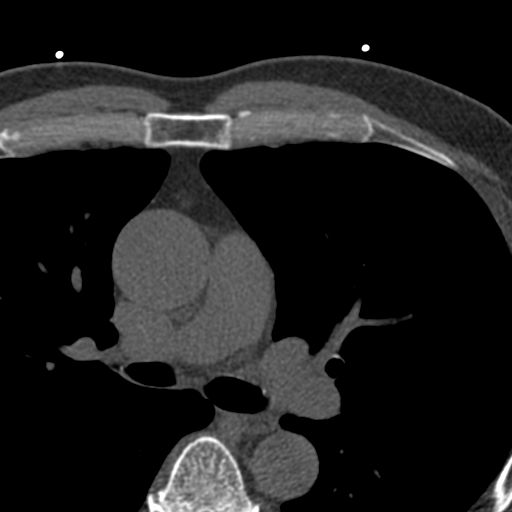

[Series 3: cascseq 2.0 bf37 st · axial · 0.78mm/px · z∈[-307,-195]mm · 5 of 85 slices shown, 7 images]
[im 15/85  vessel]
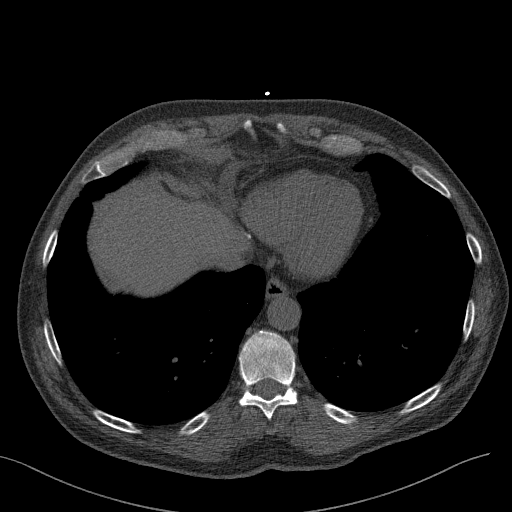
[im 15/85  lung]
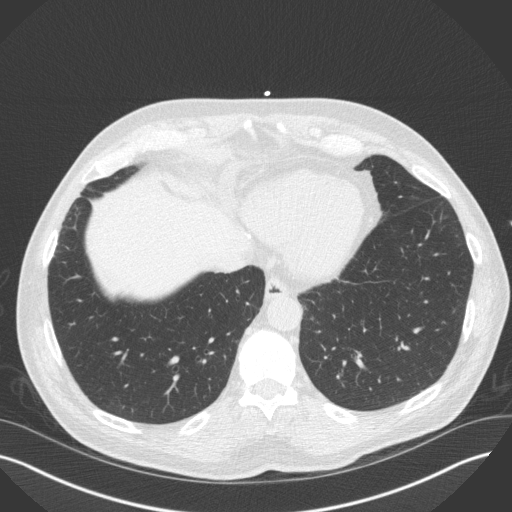
[im 29/85  vessel]
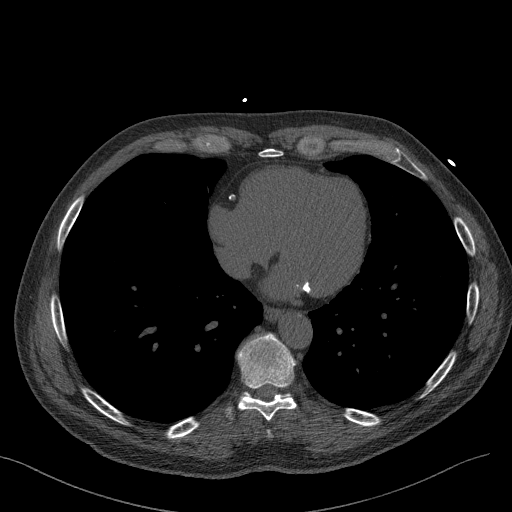
[im 43/85  vessel]
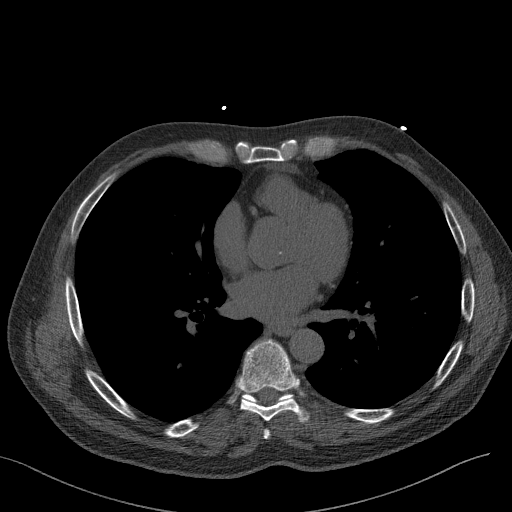
[im 57/85  vessel]
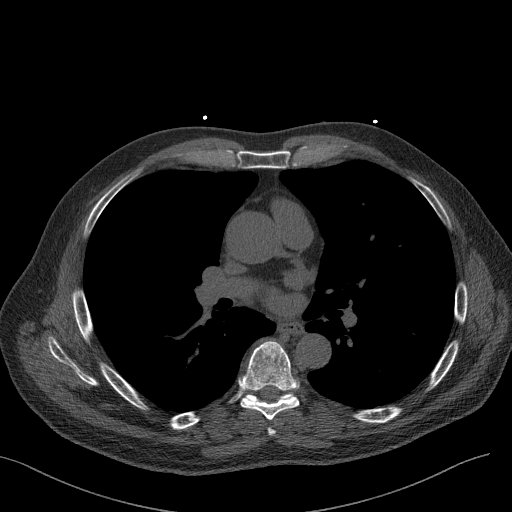
[im 71/85  vessel]
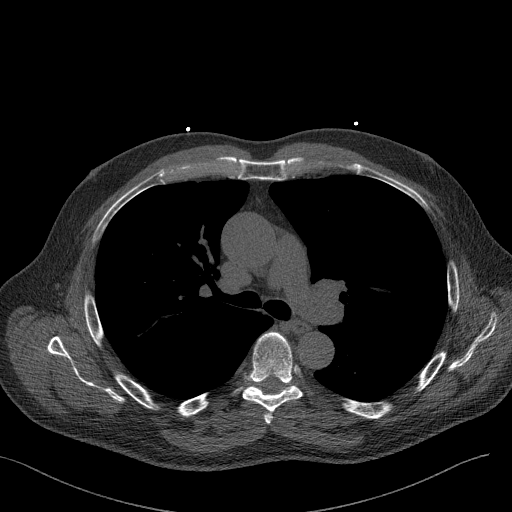
[im 71/85  lung]
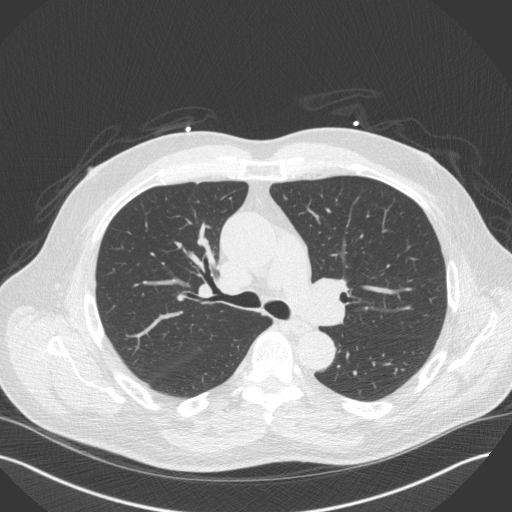

[Series 4: cascseq 2.0 br59 lung · axial · 0.78mm/px · z∈[-307,-195]mm · 5 of 85 slices shown]
[im 15/85  lung]
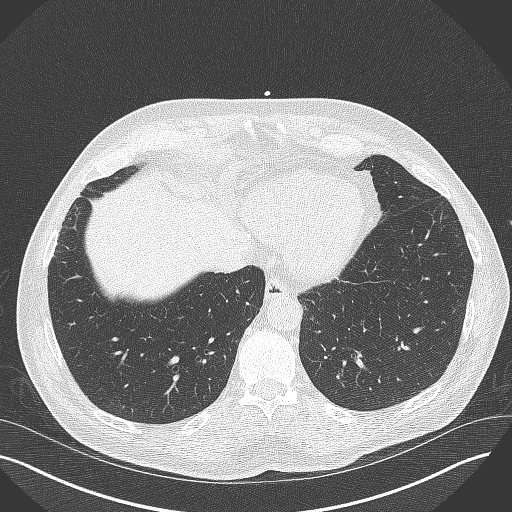
[im 29/85  lung]
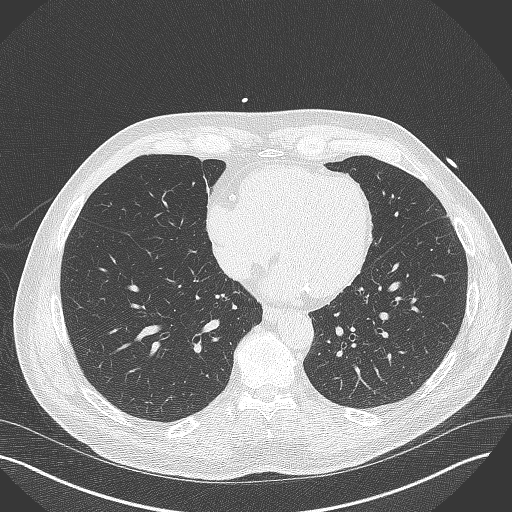
[im 43/85  lung]
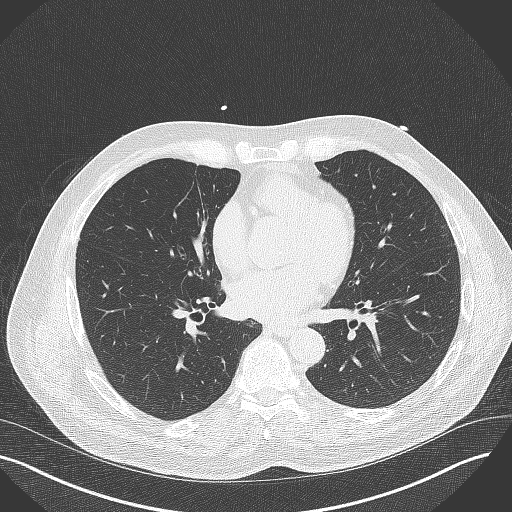
[im 57/85  lung]
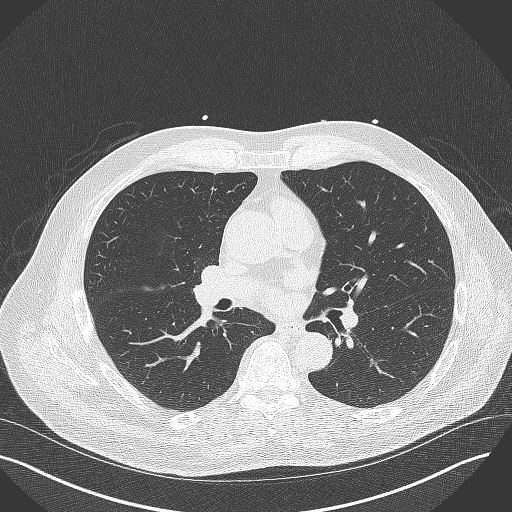
[im 71/85  lung]
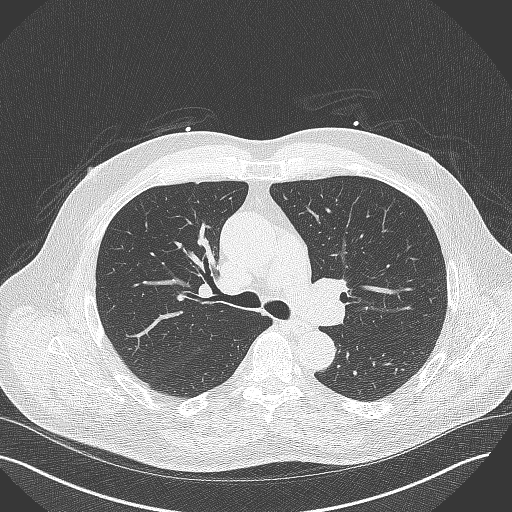

[14 of 20 positions shown; findings below may reference images not displayed]

FINDINGS: Vascular: Aortic atherosclerosis.  Tortuous thoracic aorta.

Mediastinum/Nodes: No imaged thoracic adenopathy.

Lungs/Pleura: No pleural fluid. Right minor fissure thickening and
nodularity on [DATE], likely due to small subpleural lymph nodes.

Upper Abdomen: Normal imaged portions of the liver, spleen, stomach.

Musculoskeletal: No acute osseous abnormality.
IMPRESSION: No acute findings in the imaged extracardiac chest.

Aortic Atherosclerosis (227JQ-ZL7.7).
FINDINGS: Coronary arteries: Normal origins.

Coronary Calcium Score:

Left main: 0

Left anterior descending artery: 407

Left circumflex artery: 226

Right coronary artery: 274

Total: 992

Percentile: 91st

Pericardium: Normal.

Ascending Aorta: Dilated to 4.3 cm.

Non-cardiac: See separate report from [REDACTED].
IMPRESSION: Coronary calcium score of 992 Agatston units. This was 91st
percentile for age-, race-, and sex-matched controls.

Mildly dilated ascending aorta, 4.3 cm.



If CAC=0, it is reasonable to withhold statin therapy and reassess
in 5 to 10 years, as long as higher risk conditions are absent
(diabetes mellitus, family history of premature CHD in first degree
relatives (males <55 years; females <65 years), cigarette smoking,
or LDL >=190 mg/dL).

If CAC is 1 to 99, it is reasonable to initiate statin therapy for
patients >=55 years of age.

If CAC is >=100 or >=75th percentile, it is reasonable to initiate
statin therapy at any age.

Cardiology referral should be considered for patients with CAC
scores >=400 or >=75th percentile.

*4798 AHA/ACC/AACVPR/AAPA/ABC/JUNIOR/LENOIR/SURYA UTAMA/[place]/ES/XHBXH/SHIGEHO
Guideline on the Management of Blood Cholesterol: A Report of the
American College of Cardiology/American Heart Association Task Force
on Clinical Practice Guidelines. J Am Coll Cardiol.
1570;73(24):0763-0922.

*** End of Addendum ***
FINDINGS: Vascular: Aortic atherosclerosis.  Tortuous thoracic aorta.

Mediastinum/Nodes: No imaged thoracic adenopathy.

Lungs/Pleura: No pleural fluid. Right minor fissure thickening and
nodularity on [DATE], likely due to small subpleural lymph nodes.

Upper Abdomen: Normal imaged portions of the liver, spleen, stomach.

Musculoskeletal: No acute osseous abnormality.
IMPRESSION: No acute findings in the imaged extracardiac chest.

Aortic Atherosclerosis (227JQ-ZL7.7).

## 2023-04-12 ENCOUNTER — Encounter: Payer: Self-pay | Admitting: Cardiovascular Disease

## 2023-04-20 ENCOUNTER — Other Ambulatory Visit: Payer: Self-pay | Admitting: Cardiovascular Disease

## 2023-04-20 NOTE — Telephone Encounter (Signed)
Prescription refill request for Eliquis received. Indication:afib Last office visit:2/24 Scr:1.01  11/23 Age: 67 Weight:101.7  kg  Prescription refilled

## 2023-06-04 DIAGNOSIS — R051 Acute cough: Secondary | ICD-10-CM | POA: Diagnosis not present

## 2023-06-04 DIAGNOSIS — G629 Polyneuropathy, unspecified: Secondary | ICD-10-CM | POA: Diagnosis not present

## 2023-06-04 DIAGNOSIS — J209 Acute bronchitis, unspecified: Secondary | ICD-10-CM | POA: Diagnosis not present

## 2023-06-04 DIAGNOSIS — Z6829 Body mass index (BMI) 29.0-29.9, adult: Secondary | ICD-10-CM | POA: Diagnosis not present

## 2023-07-20 ENCOUNTER — Encounter: Payer: Self-pay | Admitting: Cardiovascular Disease

## 2023-07-29 ENCOUNTER — Other Ambulatory Visit: Payer: Self-pay | Admitting: Cardiovascular Disease

## 2023-07-29 DIAGNOSIS — I480 Paroxysmal atrial fibrillation: Secondary | ICD-10-CM

## 2023-08-01 NOTE — Telephone Encounter (Addendum)
 Prescription refill request for Eliquis  received. Indication: Afib  Last office visit: 08/23/22 ([place])  Scr: 1.01 (05/04/22)  Age: 68 Weight: 101.7kg  Labs overdue. Called pt, no answer. Left message on voicemail.

## 2023-08-01 NOTE — Telephone Encounter (Signed)
 Called and spoke with pt. Pt states he can come this week or next to have updated labs drawn. Lab orders placed. Pt states he does not need a refill of Eliquis  at this time.

## 2023-08-03 DIAGNOSIS — I480 Paroxysmal atrial fibrillation: Secondary | ICD-10-CM | POA: Diagnosis not present

## 2023-08-03 LAB — CBC

## 2023-08-04 LAB — BASIC METABOLIC PANEL
BUN/Creatinine Ratio: 13 (ref 10–24)
BUN: 14 mg/dL (ref 8–27)
CO2: 25 mmol/L (ref 20–29)
Calcium: 9 mg/dL (ref 8.6–10.2)
Chloride: 102 mmol/L (ref 96–106)
Creatinine, Ser: 1.05 mg/dL (ref 0.76–1.27)
Glucose: 95 mg/dL (ref 70–99)
Potassium: 4.6 mmol/L (ref 3.5–5.2)
Sodium: 141 mmol/L (ref 134–144)
eGFR: 77 mL/min/{1.73_m2} (ref >59)

## 2023-08-04 LAB — CBC
Hematocrit: 47.6 % (ref 37.5–51.0)
Hemoglobin: 16.1 g/dL (ref 13.0–17.7)
MCH: 31.2 pg (ref 26.6–33.0)
MCHC: 33.8 g/dL (ref 31.5–35.7)
MCV: 92 fL (ref 79–97)
Platelets: 246 10*3/uL (ref 150–450)
RBC: 5.16 x10E6/uL (ref 4.14–5.80)
RDW: 12.1 % (ref 11.6–15.4)
WBC: 4.7 10*3/uL (ref 3.4–10.8)

## 2023-08-05 ENCOUNTER — Encounter: Payer: Self-pay | Admitting: Cardiovascular Disease

## 2023-08-09 ENCOUNTER — Ambulatory Visit
Admission: RE | Admit: 2023-08-09 | Discharge: 2023-08-09 | Disposition: A | Discharge status: Home / Self Care | Payer: Medicare PPO | Source: Ambulatory Visit | Attending: Cardiovascular Disease | Admitting: Cardiovascular Disease

## 2023-08-09 ENCOUNTER — Other Ambulatory Visit: Payer: Self-pay

## 2023-08-09 DIAGNOSIS — I7121 Aneurysm of the ascending aorta, without rupture: Secondary | ICD-10-CM

## 2023-08-09 DIAGNOSIS — R911 Solitary pulmonary nodule: Secondary | ICD-10-CM | POA: Diagnosis not present

## 2023-08-09 MED ORDER — METOPROLOL TARTRATE 25 MG PO TABS: 25.0000 mg | ORAL_TABLET | Freq: Two times a day (BID) | ORAL | 0 refills | Status: DC

## 2023-08-09 NOTE — Telephone Encounter (Signed)
Called pt to set an appt for Dr. Flora Lipps and send medication to make it to his appt.

## 2023-08-19 ENCOUNTER — Encounter: Payer: Self-pay | Admitting: Cardiovascular Disease

## 2023-08-20 ENCOUNTER — Encounter: Payer: Self-pay | Admitting: Cardiovascular Disease

## 2023-08-21 NOTE — Telephone Encounter (Signed)
 Called and left detailed message with provider result comments regarding lung nodule.  Results/message from Dr. Scharlene Gloss has been released to MyChart.

## 2023-08-22 ENCOUNTER — Other Ambulatory Visit: Payer: Self-pay | Admitting: Cardiovascular Disease

## 2023-09-26 ENCOUNTER — Other Ambulatory Visit: Payer: Self-pay | Admitting: Cardiovascular Disease

## 2023-10-08 NOTE — Progress Notes (Unsigned)
 Cardiology Office Note:  .   Date:  10/09/2023  ID:  Merton Border Assefa, DOB [birth year], MRN [id number] PCP: Sigmund Hazel, MD  [facility] Providers Cardiologist:  Reatha Harps, MD { History of Present Illness: .    Chief Complaint  Patient presents with   Follow-up    Kevin Larsen is a 68 y.o. male with history of CAD, HTN, HLD, pAF, aortic aneurysm who presents for follow-up.    History of Present Illness   Dezmin Squitieri "Art" is a 68 year old male with coronary artery disease, status post PCI of the RCA, who presents for follow-up.  He feels wonderful and notes that his cholesterol levels have been stable, with the last check in November 2023. He is scheduled for another check-up in June or July 2024. He believes his cholesterol levels have been 'green for the last couple of years.'  He exercises regularly, walking four miles a day, four days a week, and three miles on other days. He experiences shortness of breath when walking uphill, which he finds frustrating after five years of regular walking. His son noticed his flushed face during a walk, which concerned him. He weighs 220 pounds, up from 208 pounds.  He experiences cold hands and feet, which take a while to warm up. He notes that his hands sometimes go numb, but this has not been bothersome for the past few months. No color changes in his fingers during cold months. He also discusses his cold intolerance, noting that he feels colder than he used to, requiring more layers during walks in cooler weather.  No chest pains or trouble breathing, except when walking uphill. He has not had any episodes of atrial fibrillation since the initial occurrence and does not use nitroglycerin pills. He is currently on Eliquis 5 mg twice daily and atorvastatin 80 mg for hyperlipidemia.  His blood pressure was 104/76 mmHg today. He has a history of hypertension, which is well-controlled.  His family history includes high LPA levels, which  his children also have. He has not had any significant issues with cholesterol levels in the past.          Problem List HTN HLD -T chol 87, HDL 30, LDL 43, TG 62 3. CAD -calcium score 992 (91st percentile) -PCI prox RCA 12/19/2021 -95% D1 -> med management  -LP(a) 177  4. Ascending aortic aneurysm -40 mm 11/2021 CTA -42 mm 07/2023 5. Paroxysmal Afib -02/14/2022 -CHADSVASC=3 (age, CAD, HTN)    ROS: All other ROS reviewed and negative. Pertinent positives noted in the HPI.     Studies Reviewed: Marland Kitchen   EKG Interpretation Date/Time:  Tuesday October 09 2023 14:09:02 EDT Ventricular Rate:  59 PR Interval:  174 QRS Duration:  90 QT Interval:  406 QTC Calculation: 401 R Axis:   1  Text Interpretation: Sinus bradycardia Confirmed by Lennie Odor [phone]) on 10/09/2023 2:11:38 PM   TTE 12/06/2021  1. Left ventricular ejection fraction, by estimation, is 55 to 60%. The  left ventricle has normal function. The left ventricle has no regional  wall motion abnormalities. There is mild left ventricular hypertrophy of  the basal-septal segment. Left  ventricular diastolic parameters were normal.   2. Right ventricular systolic function is normal. The right ventricular  size is normal. There is normal pulmonary artery systolic pressure.   3. The mitral valve is abnormal. Mild mitral valve regurgitation. No  evidence of mitral stenosis. 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   4. The aortic valve is grossly normal. There is mild calcification of the  aortic valve. Aortic valve regurgitation is not visualized. No aortic  stenosis is present.   5. Aortic dilatation noted. There is mild dilatation of the ascending  aorta, measuring 41 mm.   6. The inferior vena cava is normal in size with greater than 50%  respiratory variability, suggesting right atrial pressure of 3 mmHg.  Physical Exam:   VS:  BP 104/72 (BP Location: Left  Arm, Patient Position: Sitting)   Pulse (!) 57   Ht 6' (1.829 m)   Wt 224 lb 9.6 oz (101.9 kg)   SpO2 96%   BMI 30.46 kg/m    Wt Readings from Last 3 Encounters:  10/09/23 224 lb 9.6 oz (101.9 kg)  08/23/22 224 lb 3.2 oz (101.7 kg)  05/09/22 215 lb 9.6 oz (97.8 kg)    GEN: Well nourished, well developed in no acute distress NECK: No JVD; No carotid bruits CARDIAC: RRR, no murmurs, rubs, gallops RESPIRATORY:  Clear to auscultation without rales, wheezing or rhonchi  ABDOMEN: Soft, non-tender, non-distended EXTREMITIES:  No edema; No deformity  ASSESSMENT AND PLAN: .   Assessment and Plan    Coronary Artery Disease (CAD) Status post PCI of RCA. No chest pain or dyspnea. EKG shows sinus rhythm. Small diagonal branch blockage managed medically. Aspirin discontinued due to Eliquis. - Stop aspirin. - Continue Eliquis 5 mg BID. - Stop nitroglycerin.  Paroxysmal Atrial Fibrillation No recurrence since last episode. Eliquis provides stroke protection. - Continue Eliquis 5 mg BID.  Hypertension Blood pressure well-controlled. Metoprolol discontinued due to side effects. - Wean off metoprolol: half tablet twice a day for three days, then stop.  Hyperlipidemia On atorvastatin 80 mg. Elevated LPA with familial prevalence. No current treatment for high LPA. - Continue atorvastatin 80 mg daily.  Ascending Aortic Aneurysm Aneurysm measures 40-42 mm, stable over two and a half years. Monitoring planned. - Recheck aortic aneurysm in two years.  Cold Extremities Cold extremities likely due to metoprolol. No Raynaud's phenomenon. - Wean off metoprolol: half tablet twice a day for three days, then stop.              Follow-up: Return in about 1 year (around 10/08/2024).   Signed, Gigi Kyle. Rolm Clos, MD, [facility]  [facility]  [address] [place], [place] [address] [phone]  2:33 PM

## 2023-10-09 ENCOUNTER — Encounter: Payer: Self-pay | Admitting: Cardiovascular Disease

## 2023-10-09 ENCOUNTER — Ambulatory Visit: Payer: Medicare PPO | Attending: Cardiovascular Disease | Admitting: Cardiovascular Disease

## 2023-10-09 VITALS — BP 104/72 | HR 57 | Ht 72.0 in | Wt 224.6 lb

## 2023-10-09 DIAGNOSIS — I480 Paroxysmal atrial fibrillation: Secondary | ICD-10-CM | POA: Diagnosis not present

## 2023-10-09 DIAGNOSIS — I2510 Atherosclerotic heart disease of native coronary artery without angina pectoris: Secondary | ICD-10-CM | POA: Diagnosis not present

## 2023-10-09 DIAGNOSIS — I7121 Aneurysm of the ascending aorta, without rupture: Secondary | ICD-10-CM | POA: Diagnosis not present

## 2023-10-09 DIAGNOSIS — I10 Essential (primary) hypertension: Secondary | ICD-10-CM

## 2023-10-09 DIAGNOSIS — E785 Hyperlipidemia, unspecified: Secondary | ICD-10-CM | POA: Diagnosis not present

## 2023-10-09 NOTE — Patient Instructions (Addendum)
 Medication Instructions:  -  STOP ASPIRIN - STOP METOPROLOL TARTRATE. Take 12.5 MG by mouth twice daily for three days then stop.     *If you need a refill on your cardiac medications before your next appointment, please call your pharmacy*   Lab Work: None    If you have labs (blood work) drawn today and your tests are completely normal, you will receive your results only by: MyChart Message (if you have MyChart) OR A paper copy in the mail If you have any lab test that is abnormal or we need to change your treatment, we will call you to review the results.   Testing/Procedures: None    Follow-Up: At [facility], you and your health needs are our priority.  As part of our continuing mission to provide you with exceptional heart care, we have created designated Provider Care Teams.  These Care Teams include your primary Cardiologist (physician) and Advanced Practice Providers (APPs -  Physician Assistants and Nurse Practitioners) who all work together to provide you with the care you need, when you need it.  We recommend signing up for the patient portal called "MyChart".  Sign up information is provided on this After Visit Summary.  MyChart is used to connect with patients for Virtual Visits (Telemedicine).  Patients are able to view lab/test results, encounter notes, upcoming appointments, etc.  Non-urgent messages can be sent to your provider as well.   To learn more about what you can do with MyChart, go to [email or web address].    Your next appointment:   1 year(s)  The format for your next appointment:   In Person  Provider:   Lawana Pray, FNP, Marcie Sever, PA-C, Callie Goodrich, PA-C, Kathleen Johnson, PA-C, Hao Meng, PA-C, Marlana Silvan, NP, or Katlyn West, NP    Other Instructions

## 2023-10-12 ENCOUNTER — Other Ambulatory Visit: Payer: Self-pay | Admitting: Cardiovascular Disease

## 2023-10-12 DIAGNOSIS — I480 Paroxysmal atrial fibrillation: Secondary | ICD-10-CM

## 2023-10-12 NOTE — Telephone Encounter (Signed)
 Prescription refill request for Eliquis  received. Indication: a fib Last office visit: 10/09/23 Scr: 1.05 epic 08/03/23 Age: 68 Weight: 101kg

## 2023-10-13 ENCOUNTER — Other Ambulatory Visit: Payer: Self-pay | Admitting: Cardiovascular Disease

## 2023-10-18 DIAGNOSIS — Z7901 Long term (current) use of anticoagulants: Secondary | ICD-10-CM | POA: Diagnosis not present

## 2023-10-18 DIAGNOSIS — E785 Hyperlipidemia, unspecified: Secondary | ICD-10-CM | POA: Diagnosis not present

## 2023-10-18 DIAGNOSIS — G629 Polyneuropathy, unspecified: Secondary | ICD-10-CM | POA: Diagnosis not present

## 2023-10-18 DIAGNOSIS — I4891 Unspecified atrial fibrillation: Secondary | ICD-10-CM | POA: Diagnosis not present

## 2023-10-18 DIAGNOSIS — M1990 Unspecified osteoarthritis, unspecified site: Secondary | ICD-10-CM | POA: Diagnosis not present

## 2023-10-18 DIAGNOSIS — D6869 Other thrombophilia: Secondary | ICD-10-CM | POA: Diagnosis not present

## 2023-10-18 DIAGNOSIS — I129 Hypertensive chronic kidney disease with stage 1 through stage 4 chronic kidney disease, or unspecified chronic kidney disease: Secondary | ICD-10-CM | POA: Diagnosis not present

## 2023-10-18 DIAGNOSIS — I700 Atherosclerosis of aorta: Secondary | ICD-10-CM | POA: Diagnosis not present

## 2023-10-18 DIAGNOSIS — I719 Aortic aneurysm of unspecified site, without rupture: Secondary | ICD-10-CM | POA: Diagnosis not present

## 2023-11-12 ENCOUNTER — Other Ambulatory Visit: Payer: Self-pay | Admitting: Cardiovascular Disease

## 2023-11-12 DIAGNOSIS — I480 Paroxysmal atrial fibrillation: Secondary | ICD-10-CM

## 2023-11-20 ENCOUNTER — Encounter: Payer: Self-pay | Admitting: Cardiovascular Disease

## 2023-11-22 DIAGNOSIS — D485 Neoplasm of uncertain behavior of skin: Secondary | ICD-10-CM | POA: Diagnosis not present

## 2023-11-22 DIAGNOSIS — D225 Melanocytic nevi of trunk: Secondary | ICD-10-CM | POA: Diagnosis not present

## 2023-11-22 DIAGNOSIS — C44719 Basal cell carcinoma of skin of left lower limb, including hip: Secondary | ICD-10-CM | POA: Diagnosis not present

## 2023-11-22 DIAGNOSIS — C44319 Basal cell carcinoma of skin of other parts of face: Secondary | ICD-10-CM | POA: Diagnosis not present

## 2023-11-22 DIAGNOSIS — L821 Other seborrheic keratosis: Secondary | ICD-10-CM | POA: Diagnosis not present

## 2023-11-22 DIAGNOSIS — D2261 Melanocytic nevi of right upper limb, including shoulder: Secondary | ICD-10-CM | POA: Diagnosis not present

## 2023-11-22 DIAGNOSIS — L565 Disseminated superficial actinic porokeratosis (DSAP): Secondary | ICD-10-CM | POA: Diagnosis not present

## 2023-11-22 DIAGNOSIS — Z85828 Personal history of other malignant neoplasm of skin: Secondary | ICD-10-CM | POA: Diagnosis not present

## 2023-11-22 DIAGNOSIS — L918 Other hypertrophic disorders of the skin: Secondary | ICD-10-CM | POA: Diagnosis not present

## 2023-11-29 ENCOUNTER — Other Ambulatory Visit: Payer: Self-pay | Admitting: Cardiovascular Disease

## 2023-11-29 DIAGNOSIS — I480 Paroxysmal atrial fibrillation: Secondary | ICD-10-CM

## 2023-11-29 NOTE — Telephone Encounter (Signed)
 Prescription refill request for Eliquis  received. Indication: Afib  Last office visit: 10/09/23 ([place])  Scr: 1.05 (08/03/23)  Age: 68 Weight: 101.9kg  Appropriate dose. Refill sent.

## 2024-01-01 DIAGNOSIS — Z85828 Personal history of other malignant neoplasm of skin: Secondary | ICD-10-CM | POA: Diagnosis not present

## 2024-01-01 DIAGNOSIS — C44319 Basal cell carcinoma of skin of other parts of face: Secondary | ICD-10-CM | POA: Diagnosis not present

## 2024-01-09 DIAGNOSIS — Z0000 Encounter for general adult medical examination without abnormal findings: Secondary | ICD-10-CM | POA: Diagnosis not present

## 2024-01-09 DIAGNOSIS — Z1331 Encounter for screening for depression: Secondary | ICD-10-CM | POA: Diagnosis not present

## 2024-01-09 DIAGNOSIS — I7121 Aneurysm of the ascending aorta, without rupture: Secondary | ICD-10-CM | POA: Diagnosis not present

## 2024-01-09 DIAGNOSIS — E663 Overweight: Secondary | ICD-10-CM | POA: Diagnosis not present

## 2024-01-09 DIAGNOSIS — I2510 Atherosclerotic heart disease of native coronary artery without angina pectoris: Secondary | ICD-10-CM | POA: Diagnosis not present

## 2024-01-09 DIAGNOSIS — E7800 Pure hypercholesterolemia, unspecified: Secondary | ICD-10-CM | POA: Diagnosis not present

## 2024-01-09 DIAGNOSIS — Z125 Encounter for screening for malignant neoplasm of prostate: Secondary | ICD-10-CM | POA: Diagnosis not present

## 2024-01-09 DIAGNOSIS — R7303 Prediabetes: Secondary | ICD-10-CM | POA: Diagnosis not present

## 2024-01-09 DIAGNOSIS — Z86018 Personal history of other benign neoplasm: Secondary | ICD-10-CM | POA: Diagnosis not present

## 2024-01-09 DIAGNOSIS — I10 Essential (primary) hypertension: Secondary | ICD-10-CM | POA: Diagnosis not present

## 2024-01-09 DIAGNOSIS — I480 Paroxysmal atrial fibrillation: Secondary | ICD-10-CM | POA: Diagnosis not present

## 2024-01-09 LAB — LAB REPORT - SCANNED
A1c: 5.8
EGFR: 86

## 2024-01-10 ENCOUNTER — Encounter: Payer: Self-pay | Admitting: Family Medicine

## 2024-01-12 ENCOUNTER — Ambulatory Visit: Payer: Self-pay | Admitting: Cardiovascular Disease

## 2024-04-24 ENCOUNTER — Other Ambulatory Visit: Payer: Self-pay | Admitting: Cardiovascular Disease

## 2024-04-24 DIAGNOSIS — I480 Paroxysmal atrial fibrillation: Secondary | ICD-10-CM

## 2024-04-25 NOTE — Telephone Encounter (Signed)
 Prescription refill request for Eliquis  received. Indication:  A-Fib Last office visit:  10/09/2023 Scr:  0.96  last labs scanned in chart on 01/09/2024 Age: 68 yrs. Weight: 101.9 kg  Pt meets protocol and pt's medication was sent to pt's pharmacy for 6 mos.

## 2024-05-08 ENCOUNTER — Encounter: Payer: Self-pay | Admitting: Cardiovascular Disease

## 2024-05-08 ENCOUNTER — Encounter (HOSPITAL_COMMUNITY): Payer: Self-pay | Admitting: Emergency Medicine

## 2024-05-08 ENCOUNTER — Emergency Department (HOSPITAL_COMMUNITY): Admission: EM | Admit: 2024-05-08 | Discharge: 2024-05-08 | Disposition: A | Discharge status: Home / Self Care

## 2024-05-08 ENCOUNTER — Emergency Department (HOSPITAL_COMMUNITY)

## 2024-05-08 ENCOUNTER — Other Ambulatory Visit: Payer: Self-pay

## 2024-05-08 ENCOUNTER — Telehealth: Payer: Self-pay | Admitting: Cardiovascular Disease

## 2024-05-08 DIAGNOSIS — I4891 Unspecified atrial fibrillation: Secondary | ICD-10-CM | POA: Insufficient documentation

## 2024-05-08 DIAGNOSIS — Z7901 Long term (current) use of anticoagulants: Secondary | ICD-10-CM | POA: Diagnosis not present

## 2024-05-08 DIAGNOSIS — Z79899 Other long term (current) drug therapy: Secondary | ICD-10-CM | POA: Insufficient documentation

## 2024-05-08 DIAGNOSIS — I10 Essential (primary) hypertension: Secondary | ICD-10-CM | POA: Diagnosis not present

## 2024-05-08 DIAGNOSIS — R002 Palpitations: Secondary | ICD-10-CM | POA: Diagnosis not present

## 2024-05-08 HISTORY — DX: Unspecified atrial fibrillation: I48.91

## 2024-05-08 LAB — BASIC METABOLIC PANEL WITH GFR
Anion gap: 9 (ref 5–15)
BUN: 15 mg/dL (ref 8–23)
CO2: 20 mmol/L — ABNORMAL LOW (ref 22–32)
Calcium: 8.7 mg/dL — ABNORMAL LOW (ref 8.9–10.3)
Chloride: 110 mmol/L (ref 98–111)
Creatinine, Ser: 0.92 mg/dL (ref 0.61–1.24)
GFR, Estimated: >60 mL/min (ref >60)
Glucose, Bld: 112 mg/dL — ABNORMAL HIGH (ref 70–99)
Potassium: 3.9 mmol/L (ref 3.5–5.1)
Sodium: 139 mmol/L (ref 135–145)

## 2024-05-08 LAB — CBC
HCT: 47 % (ref 39.0–52.0)
Hemoglobin: 16.1 g/dL (ref 13.0–17.0)
MCH: 30.8 pg (ref 26.0–34.0)
MCHC: 34.3 g/dL (ref 30.0–36.0)
MCV: 90 fL (ref 80.0–100.0)
Platelets: 238 K/uL (ref 150–400)
RBC: 5.22 MIL/uL (ref 4.22–5.81)
RDW: 12.3 % (ref 11.5–15.5)
WBC: 8.8 K/uL (ref 4.0–10.5)
nRBC: 0 % (ref 0.0–0.2)

## 2024-05-08 LAB — TROPONIN I (HIGH SENSITIVITY): Troponin I (High Sensitivity): 20 ng/L — ABNORMAL HIGH (ref <18)

## 2024-05-08 MED ORDER — DILTIAZEM HCL-DEXTROSE 125-5 MG/125ML-% IV SOLN (PREMIX)
5.0000 mg/h | INTRAVENOUS | Status: DC
  Administered 2024-05-08: 5 mg/h via INTRAVENOUS
  Filled 2024-05-08: qty 125

## 2024-05-08 MED ORDER — LACTATED RINGERS IV BOLUS
1000.0000 mL | Freq: Once | INTRAVENOUS | Status: AC
  Administered 2024-05-08: 1000 mL via INTRAVENOUS

## 2024-05-08 MED ORDER — ETOMIDATE 2 MG/ML IV SOLN
10.0000 mg | Freq: Once | INTRAVENOUS | Status: AC
  Administered 2024-05-08: 10 mg via INTRAVENOUS
  Filled 2024-05-08: qty 10

## 2024-05-08 NOTE — ED Provider Triage Note (Signed)
 Emergency Medicine Provider Triage Evaluation Note  Kevin Larsen , a 68 y.o. male  was evaluated in triage.  Pt complains of A-fib with RVR.  Patient has a history of coronary artery disease and A-fib he is compliant with his Eliquis  twice daily.  Patient reports that yesterday he was not feeling well and this morning when he woke up to go take some measurements for a shot that he was moving he felt his heart racing and knew that he was in A-fib.  He is unsure if he was in it yesterday.  He would like to get a cardioversion.  Review of Systems  Positive: A-fib Negative: Loss of consciousness  Physical Exam  BP 115/79 (BP Location: Left Arm)   Pulse (!) 54   Temp 97.9 F (36.6 C)   Resp 18   Ht 6' (1.829 m)   Wt 93.4 kg   SpO2 98%   BMI 27.94 kg/m  Gen:   Awake, no distress   Resp:  Normal effort  MSK:   Moves extremities without difficulty  Other:  Irregularly irregular heartbeat, rate controlled  Medical Decision Making  Medically screening exam initiated at 12:55 PM.  Appropriate orders placed.  Zackaria Ives was informed that the remainder of the evaluation will be completed by another provider, this initial triage assessment does not replace that evaluation, and the importance of remaining in the ED until their evaluation is complete.     Arloa Chroman, PA-C 05/08/24 1256

## 2024-05-08 NOTE — Telephone Encounter (Signed)
 Pt is currently being treated in the ED.

## 2024-05-08 NOTE — Progress Notes (Signed)
 RT at bedside for synchronized cardioversion.

## 2024-05-08 NOTE — Progress Notes (Unsigned)
 Cardiology Office Note:  .   Date:  05/09/2024  ID:  Kevin Larsen, DOB Sep 27, 1955, MRN [id number] PCP: Cleotilde Planas, MD  [facility] Providers Cardiologist:  Darryle ONEIDA Decent, MD {  History of Present Illness: .    Chief Complaint  Patient presents with   Follow-up    Kevin Larsen is a 68 y.o. male with history of CAD, HTN, HLD, pAF who presents for follow-up.    History of Present Illness   Kevin Larsen is a 68 year old male with paroxysmal atrial fibrillation who presents for follow-up after cardioversion.  He was admitted to the ER yesterday with atrial fibrillation and underwent a cardioversion. The day before the episode, he engaged in strenuous activities, including rearranging sheds and fixing bikes, which lasted several hours. During these activities, he felt lightheaded and experienced a sensation of his heart racing, which he initially attributed to exertion. The following morning, he noticed significant shortness of breath and a racing heart rate, prompting him to contact his healthcare provider and subsequently visit the ER.  He has a history of atrial fibrillation, with a previous episode occurring two years and three months ago, which required a three-day hospital stay but resolved without cardioversion. He has been on Eliquis  for anticoagulation since that time. He reports no chest pain or trouble breathing and engages in regular physical activity, walking four to five miles several times a week.  He is currently on Eliquis  5 mg twice daily and has a supply of metoprolol  at home from a previous prescription. He also takes atorvastatin  for hyperlipidemia and losartan  for hypertension.  He has coronary artery disease with a history of PCI to the proximal RCA and is on atorvastatin , which he has been taking at a dose of 80 mg daily. His most recent LDL cholesterol level was 41 mg/dL. No symptoms of angina.  He has a history of hypertension, managed with losartan   25 mg daily. No current symptoms related to hypertension.          Problem List HTN HLD -T chol 90, HDL 35, :D: 41, TG 57 3. CAD -calcium  score 992 (91st percentile) -PCI prox RCA 12/19/2021 -95% D1 -> med management  -LP(a) 177  4. Ascending aortic aneurysm -40 mm 11/2021 CTA -42 mm 07/2023 5. Paroxysmal Afib -02/14/2022 -CHADSVASC=3 (age, CAD, HTN) -DCCV 05/08/2024    ROS: All other ROS reviewed and negative. Pertinent positives noted in the HPI.     Studies Reviewed: SABRA   EKG Interpretation Date/Time:  Friday May 09 2024 14:20:04 EST Ventricular Rate:  75 PR Interval:  152 QRS Duration:  84 QT Interval:  400 QTC Calculation: 446 R Axis:   7  Text Interpretation: Sinus rhythm with Premature atrial complexes Confirmed by Decent Darryle [phone]) on 05/09/2024 2:24:12 PM   TTE 12/06/2021  1. Left ventricular ejection fraction, by estimation, is 55 to 60%. The  left ventricle has normal function. The left ventricle has no regional  wall motion abnormalities. There is mild left ventricular hypertrophy of  the basal-septal segment. Left  ventricular diastolic parameters were normal.   2. Right ventricular systolic function is normal. The right ventricular  size is normal. There is normal pulmonary artery systolic pressure.   3. The mitral valve is abnormal. Mild mitral valve regurgitation. No  evidence of mitral stenosis. 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   4. The aortic valve is grossly normal.  There is mild calcification of the  aortic valve. Aortic valve regurgitation is not visualized. No aortic  stenosis is present.   5. Aortic dilatation noted. There is mild dilatation of the ascending  aorta, measuring 41 mm.   6. The inferior vena cava is normal in size with greater than 50%  respiratory variability, suggesting right atrial pressure of 3 mmHg.  Physical Exam:   VS:  BP 110/73   Pulse 75    Ht 6' (1.829 m)   Wt 217 lb (98.4 kg)   SpO2 95%   BMI 29.43 kg/m    Wt Readings from Last 3 Encounters:  05/09/24 217 lb (98.4 kg)  05/08/24 206 lb (93.4 kg)  10/09/23 224 lb 9.6 oz (101.9 kg)    GEN: Well nourished, well developed in no acute distress NECK: No JVD; No carotid bruits CARDIAC: RRR, no murmurs, rubs, gallops RESPIRATORY:  Clear to auscultation without rales, wheezing or rhonchi  ABDOMEN: Soft, non-tender, non-distended EXTREMITIES:  No edema; No deformity  ASSESSMENT AND PLAN: .   Assessment and Plan    Paroxysmal atrial fibrillation, status post recent cardioversion, planned for rhythm control and ablation Recent AFib episode post-exertion, successfully cardioverted. Discussed ablation to prevent recurrence and potential Watchman procedure to discontinue Eliquis . Multaq prescribed for rhythm control. - Start Multaq 400 mg BID. - Continue Eliquis  5 mg BID. - Referred to Dr. Almetta or Dr. Kennyth for AFib ablation and Watchman procedure. - Ordered updated echocardiogram. - Prescribed metoprolol  25 mg PRN for HR >100 bpm or AFib. - Advised to call if AFib recurs for potential outpatient cardioversion.  Atherosclerotic heart disease of native coronary artery, status post PCI, without angina Post-PCI to RCA, asymptomatic. LDL well-controlled. - Reduced atorvastatin  to 40 mg daily.  Aneurysm of ascending aorta without rupture - yearly follow-up imaging  Essential hypertension Hypertension controlled with losartan . - Continue losartan  25 mg daily.  Hyperlipidemia LDL well-controlled, atorvastatin  dose reduced due to low LDL. - Reduced atorvastatin  to 40 mg daily.              Follow-up: Return in about 6 months (around 11/06/2024).  Signed, Darryle DASEN. Barbaraann, MD, [facility]  [facility]  [address] [place], [place] [address] [phone]  3:01 PM

## 2024-05-08 NOTE — Telephone Encounter (Signed)
 Spoke with pt who is reporting his Apple watch demonstrates he is in At Fib and HR is 150 bpm.  He reports he believes this started last night.  Has a history of At Fib and has been taking Eliquis  for some time now.  No missed doses.  He has been feeling his heart racing, lightheadedness and shortness of breath with walking.  Advised pt to report to [facility] ED for further evaluation and treatment.  He states understanding. Will forward to Dr Barbaraann for his knowledge.

## 2024-05-08 NOTE — Telephone Encounter (Signed)
 Patient c/o Palpitations:  STAT if patient reporting lightheadedness, shortness of breath, or chest pain  How long have you had palpitations/irregular HR/ Afib? Are you having the symptoms now? Since last night, worse today    Are you currently experiencing lightheadedness, SOB or CP? Lightheaded, racing heart  Do you have a history of afib (atrial fibrillation) or irregular heart rhythm? Yes   Have you checked your BP or HR? (document readings if available): watch says over 150 resting   Are you experiencing any other symptoms? No  STAT if HR is under 50 or over 120 (normal HR is 60-100 beats per minute)  What is your heart rate? 150s   Do you have a log of your heart rate readings (document readings)? On watch   Do you have any other symptoms? Lightheaded, racing heart   Please advise. Call transferred.

## 2024-05-08 NOTE — ED Provider Notes (Signed)
 [facility] EMERGENCY DEPARTMENT AT [facility] Provider Note   CSN: [id number] Arrival date & time: 05/08/24  1159     Patient presents with: Atrial Fibrillation, Shortness of Breath, and Palpitations   Kevin Larsen is a 68 y.o. male.   68 year old male with past medical history of atrial fibrillation hyperlipidemia presenting to the emergency department today with intermittent palpitations.  The patient states that this started yesterday evening.  States that he felt like he was having some indigestion which passed after eating and he was feeling a little bit better.  He states he was feeling his palpitations at the time.  Went to bed and woke up this morning feeling back to normal.  He states that he then started with palpitations and some lightheadedness so he came to the ER for further evaluation.  The patient states that he is on Eliquis  and has been taking this as prescribed.  Denies any missed doses with this.   Atrial Fibrillation Associated symptoms include shortness of breath.  Shortness of Breath Palpitations Associated symptoms: shortness of breath        Prior to Admission medications   Medication Sig Start Date End Date Taking? Authorizing Provider  acetaminophen  (TYLENOL ) 500 MG tablet Take 500 mg by mouth every 6 (six) hours as needed. Patient not taking: Reported on 10/09/2023    [provider]  apixaban  (ELIQUIS ) 5 MG TABS tablet TAKE 1 TABLET TWICE DAILY 04/25/24   O'Neal, Darryle Ned, MD  atorvastatin  (LIPITOR ) 80 MG tablet TAKE 1 TABLET (80 MG TOTAL) BY MOUTH DAILY. 10/15/23   O'NealDarryle Ned, MD  B Complex CAPS Take 1 capsule by mouth daily at 6 (six) AM.    [provider]  doxylamine , Sleep, (SLEEP AID) 25 MG tablet Take 25 mg by mouth at bedtime as needed for sleep. [place]    [provider]  losartan  (COZAAR ) 25 MG tablet Take 1 tablet (25 mg total) by mouth daily. 02/16/22   Marylu Leita SAUNDERS, NP  nitroGLYCERIN   (NITROSTAT ) 0.4 MG SL tablet Place 1 tablet (0.4 mg total) under the tongue every 5 (five) minutes as needed for chest pain. Patient not taking: Reported on 10/09/2023 12/02/21 05/09/22  Barbaraann Darryle Ned, MD  SUPER B COMPLEX/C PO Take 1 tablet by mouth every morning.    [provider]  Turmeric (QC TUMERIC COMPLEX PO) With black pepper 03/28/22   [provider]    Allergies: Patient has no allergy information on record.    Review of Systems  Respiratory:  Positive for shortness of breath.   Cardiovascular:  Positive for palpitations.  All other systems reviewed and are negative.   Updated Vital Signs BP 125/78 (BP Location: Left Arm)   Pulse 68   Temp 98 F (36.7 C) (Oral)   Resp 18   Ht 6' (1.829 m)   Wt 93.4 kg   SpO2 100%   BMI 27.94 kg/m   Physical Exam Vitals and nursing note reviewed.   Gen: NAD Eyes: PERRL, EOMI HEENT: no oropharyngeal swelling Neck: trachea midline Resp: clear to auscultation bilaterally Card: Tachycardic, irregular, no murmurs, rubs, or gallops Abd: nontender, nondistended Extremities: no calf tenderness, no edema Vascular: 2+ radial pulses bilaterally, 2+ DP pulses bilaterally Neuro: No focal deficits Skin: no rashes Psyc: acting appropriately   (all labs ordered are listed, but only abnormal results are displayed) Labs Reviewed  BASIC METABOLIC PANEL WITH GFR - Abnormal; Notable for the following components:  Result Value   CO2 20 (*)    Glucose, Bld 112 (*)    Calcium  8.7 (*)    All other components within normal limits  TROPONIN I (HIGH SENSITIVITY) - Abnormal; Notable for the following components:   Troponin I (High Sensitivity) 20 (*)    All other components within normal limits  CBC    EKG: EKG Interpretation Date/Time:  Thursday May 08 2024 12:45:53 EST Ventricular Rate:  148 PR Interval:    QRS Duration:  80 QT Interval:  298 QTC Calculation: 467 R Axis:   30  Text  Interpretation: Atrial fibrillation with rapid ventricular response Nonspecific ST and T wave abnormality Abnormal ECG When compared with ECG of 09-Oct-2023 14:09, PREVIOUS ECG IS PRESENT Confirmed by Ula Barter [phone]) on 05/08/2024 1:22:29 PM  Radiology: [facility] Chest 2 View Result Date: 05/08/2024 CLINICAL DATA:  Palpitation. EXAM: CHEST - 2 VIEW COMPARISON:  CT chest 08/09/2023. FINDINGS: The heart size and mediastinal contours are within normal limits. No focal consolidation, pleural effusion, or pneumothorax. No acute osseous abnormality. IMPRESSION: No acute cardiopulmonary findings. Electronically Signed   By: Harrietta Sherry M.D.   On: 05/08/2024 13:53     .Cardioversion  Date/Time: 05/08/2024 4:24 PM  Performed by: Ula Barter SAUNDERS, MD Authorized by: Ula Barter SAUNDERS, MD   Consent:    Consent obtained:  Written   Consent given by:  Patient   Risks discussed:  Cutaneous burn, death and induced arrhythmia   Alternatives discussed:  No treatment Pre-procedure details:    Cardioversion basis:  Emergent   Rhythm:  Atrial fibrillation   Electrode placement:  Anterior-posterior Patient sedated: Yes. Refer to sedation procedure documentation for details of sedation.  Attempt one:    Cardioversion mode:  Synchronous   Waveform:  Monophasic   Shock (Joules):  120   Shock outcome:  Conversion to normal sinus rhythm Post-procedure details:    Patient status:  Awake   Patient tolerance of procedure:  Tolerated well, no immediate complications .Sedation  Date/Time: 05/08/2024 4:26 PM  Performed by: Ula Barter SAUNDERS, MD Authorized by: Ula Barter SAUNDERS, MD   Consent:    Consent obtained:  Written   Consent given by:  Patient   Risks discussed:  Allergic reaction, prolonged hypoxia resulting in organ damage, dysrhythmia, prolonged sedation necessitating reversal, inadequate sedation, respiratory compromise necessitating ventilatory assistance and intubation, nausea and vomiting   Alternatives  discussed:  Analgesia without sedation Universal protocol:    Immediately prior to procedure, a time out was called: yes   Pre-sedation assessment:    Time since last food or drink:  1000   ASA classification: class 2 - patient with mild systemic disease     Mouth opening:  3 or more finger widths   Thyromental distance:  4 finger widths   Mallampati score:  I - soft palate, uvula, fauces, pillars visible   Neck mobility: normal     Pre-sedation assessments completed and reviewed: pre-procedure airway patency not reviewed, pre-procedure cardiovascular function not reviewed, pre-procedure hydration status not reviewed, pre-procedure mental status not reviewed, pre-procedure nausea and vomiting status not reviewed, pre-procedure pain level not reviewed, pre-procedure respiratory function not reviewed and pre-procedure temperature not reviewed   A pre-sedation assessment was completed prior to the start of the procedure Procedure details (see MAR for exact dosages):    Sedation:  Etomidate   Intended level of sedation: deep   Total Provider sedation time (minutes):  10 Post-procedure details:   A post-sedation assessment  was completed following the completion of the procedure.   Attendance: Constant attendance by certified staff until patient recovered     Recovery: Patient returned to pre-procedure baseline     Post-sedation assessments completed and reviewed: post-procedure airway patency not reviewed, post-procedure cardiovascular function not reviewed, post-procedure hydration status not reviewed, post-procedure mental status not reviewed, post-procedure nausea and vomiting status not reviewed, pain score not reviewed, post-procedure respiratory function not reviewed and post-procedure temperature not reviewed     Procedure completion:  Tolerated well, no immediate complications    CRITICAL CARE Performed by: Prentice JONELLE Medicus   Total critical care time: 30 minutes  Critical care time was  exclusive of separately billable procedures and treating other patients.  Critical care was necessary to treat or prevent imminent or life-threatening deterioration.  Critical care was time spent personally by me on the following activities: development of treatment plan with patient and/or surrogate as well as nursing, discussions with consultants, evaluation of patient's response to treatment, examination of patient, obtaining history from patient or surrogate, ordering and performing treatments and interventions, ordering and review of laboratory studies, ordering and review of radiographic studies, pulse oximetry and re-evaluation of patient's condition.   Medications Ordered in the ED  lactated ringers bolus 1,000 mL (0 mLs Intravenous Stopped 05/08/24 1537)  etomidate (AMIDATE) injection 10 mg (10 mg Intravenous Given 05/08/24 1553)                                    Medical Decision Making 68 year old male with past medical history of hypertension and atrial fibrillation on Eliquis  with no missed doses presenting to the emergency department today in atrial fibrillation with RVR.  I will further evaluate the patient here with basic labs as well as a troponin given the episode of indigestion to eval for atypical ACS.  Will give the patient IV fluids here and started him on a Cardizem  infusion for rate control and to see if this would lead to conversion.  If not we will discuss his case with cardiology regarding need for cardioversion as patient has been compliant with his Eliquis .  Amount and/or Complexity of Data Reviewed Labs: ordered. Radiology: ordered.  Risk Prescription drug management.        Final diagnoses:  Atrial fibrillation with RVR [facility])    ED Discharge Orders     None          Medicus Prentice JONELLE, MD 05/08/24 1627

## 2024-05-08 NOTE — Discharge Instructions (Signed)
 Please follow-up with Dr. Barbaraann tomorrow at 2 PM.  Your heart rate was a little low after the cardioversion so hold off on the metoprolol  until you see Dr. Barbaraann tomorrow to see if he would like to make any adjustments to your medications instead of the metoprolol .  Return to the ER for worsening symptoms.

## 2024-05-08 NOTE — ED Triage Notes (Signed)
 Patient reports that has a-fib. His cardiologist sent him to the ED for a high heart rate. He reports that his heart rate is over 150. He takes his eliquis  as prescribed.  He was sent over for a cardioversion

## 2024-05-09 ENCOUNTER — Encounter: Payer: Self-pay | Admitting: Cardiovascular Disease

## 2024-05-09 ENCOUNTER — Emergency Department: Admitting: Cardiovascular Disease

## 2024-05-09 VITALS — BP 110/73 | HR 75 | Ht 72.0 in | Wt 217.0 lb

## 2024-05-09 DIAGNOSIS — I7121 Aneurysm of the ascending aorta, without rupture: Secondary | ICD-10-CM

## 2024-05-09 DIAGNOSIS — I2510 Atherosclerotic heart disease of native coronary artery without angina pectoris: Secondary | ICD-10-CM | POA: Diagnosis not present

## 2024-05-09 DIAGNOSIS — E785 Hyperlipidemia, unspecified: Secondary | ICD-10-CM

## 2024-05-09 DIAGNOSIS — I480 Paroxysmal atrial fibrillation: Secondary | ICD-10-CM

## 2024-05-09 DIAGNOSIS — I10 Essential (primary) hypertension: Secondary | ICD-10-CM

## 2024-05-09 MED ORDER — ATORVASTATIN CALCIUM 40 MG PO TABS: 40.0000 mg | ORAL_TABLET | Freq: Every day | ORAL | 3 refills | Status: AC

## 2024-05-09 MED ORDER — DRONEDARONE HCL 400 MG PO TABS: 400.0000 mg | ORAL_TABLET | Freq: Two times a day (BID) | ORAL | 2 refills | Status: AC

## 2024-05-09 MED ORDER — DRONEDARONE HCL 400 MG PO TABS: 400.0000 mg | ORAL_TABLET | Freq: Two times a day (BID) | ORAL | 0 refills | Status: DC

## 2024-05-09 NOTE — Patient Instructions (Signed)
 Medication Instructions:  Your physician has recommended you make the following change in your medication:   -Start Dronedarone (Multaq) 400mg  twice daily.  -Reduce atorvastatin  (lipitor ) to 40mg  once daily.  -Metoprolol  tartrate (lopressor ) 25mg  as needed for heart rates greater than 100bpm.  *If you need a refill on your cardiac medications before your next appointment, please call your pharmacy*   Testing/Procedures: Your physician has requested that you have an echocardiogram. Echocardiography is a painless test that uses sound waves to create images of your heart. It provides your doctor with information about the size and shape of your heart and how well your heart's chambers and valves are working. This procedure takes approximately one hour. There are no restrictions for this procedure. Please do NOT wear cologne, perfume, aftershave, or lotions (deodorant is allowed). Please arrive 15 minutes prior to your appointment time.  Please note: We ask at that you not bring children with you during ultrasound (echo/ vascular) testing. Due to room size and safety concerns, children are not allowed in the ultrasound rooms during exams. Our front office staff cannot provide observation of children in our lobby area while testing is being conducted. An adult accompanying a patient to their appointment will only be allowed in the ultrasound room at the discretion of the ultrasound technician under special circumstances. We apologize for any inconvenience.     Follow-Up: At [facility], you and your health needs are our priority.  As part of our continuing mission to provide you with exceptional heart care, our providers are all part of one team.  This team includes your primary Cardiologist (physician) and Advanced Practice Providers or APPs (Physician Assistants and Nurse Practitioners) who all work together to provide you with the care you need, when you need it.  Your next  appointment:   6 month(s)  Provider:   Darryle ONEIDA Decent, MD

## 2024-05-13 DIAGNOSIS — S4992XA Unspecified injury of left shoulder and upper arm, initial encounter: Secondary | ICD-10-CM | POA: Diagnosis not present

## 2024-05-13 DIAGNOSIS — M25571 Pain in right ankle and joints of right foot: Secondary | ICD-10-CM | POA: Diagnosis not present

## 2024-05-13 DIAGNOSIS — S82831A Other fracture of upper and lower end of right fibula, initial encounter for closed fracture: Secondary | ICD-10-CM | POA: Diagnosis not present

## 2024-05-15 DIAGNOSIS — M25571 Pain in right ankle and joints of right foot: Secondary | ICD-10-CM | POA: Diagnosis not present

## 2024-05-15 DIAGNOSIS — S82831A Other fracture of upper and lower end of right fibula, initial encounter for closed fracture: Secondary | ICD-10-CM | POA: Diagnosis not present

## 2024-05-18 ENCOUNTER — Encounter: Payer: Self-pay | Admitting: Cardiovascular Disease

## 2024-05-19 ENCOUNTER — Telehealth: Payer: Self-pay | Admitting: Cardiovascular Disease

## 2024-05-19 NOTE — Telephone Encounter (Signed)
 Patient called stating he has not be contact about the the ablation or the watchman procedure and it has been over a week. He states he was told someone would contact him.

## 2024-05-19 NOTE — Telephone Encounter (Signed)
 Spoke with patient. Confirmed he has consult with Dr. Kennyth for consideration of concomitant PVI/LAAO scheduled 07/01/24.  Reviewed if a candidate would schedule once okay with Dr. Kennyth.

## 2024-05-20 ENCOUNTER — Telehealth: Payer: Self-pay

## 2024-05-20 DIAGNOSIS — S82831D Other fracture of upper and lower end of right fibula, subsequent encounter for closed fracture with routine healing: Secondary | ICD-10-CM | POA: Diagnosis not present

## 2024-05-20 DIAGNOSIS — M25571 Pain in right ankle and joints of right foot: Secondary | ICD-10-CM | POA: Diagnosis not present

## 2024-05-20 NOTE — Telephone Encounter (Addendum)
 CT from 12/01/2021  Narrow neck and elliptical ostium Max 20/ AVG 15/ Depth 13.5 Likely use a 20mm device Inf/Ant TSP RAO 4 CAU 20

## 2024-05-29 DIAGNOSIS — S82831D Other fracture of upper and lower end of right fibula, subsequent encounter for closed fracture with routine healing: Secondary | ICD-10-CM | POA: Diagnosis not present

## 2024-05-29 DIAGNOSIS — M25571 Pain in right ankle and joints of right foot: Secondary | ICD-10-CM | POA: Diagnosis not present

## 2024-06-17 ENCOUNTER — Ambulatory Visit (HOSPITAL_COMMUNITY)
Admission: RE | Admit: 2024-06-17 | Discharge: 2024-06-17 | Disposition: A | Discharge status: Home / Self Care | Source: Ambulatory Visit | Attending: Internal Medicine | Admitting: Internal Medicine

## 2024-06-17 DIAGNOSIS — I2510 Atherosclerotic heart disease of native coronary artery without angina pectoris: Secondary | ICD-10-CM | POA: Insufficient documentation

## 2024-06-17 DIAGNOSIS — I10 Essential (primary) hypertension: Secondary | ICD-10-CM | POA: Diagnosis present

## 2024-06-17 DIAGNOSIS — I7121 Aneurysm of the ascending aorta, without rupture: Secondary | ICD-10-CM | POA: Diagnosis present

## 2024-06-17 DIAGNOSIS — I480 Paroxysmal atrial fibrillation: Secondary | ICD-10-CM | POA: Diagnosis present

## 2024-06-17 DIAGNOSIS — E785 Hyperlipidemia, unspecified: Secondary | ICD-10-CM | POA: Diagnosis present

## 2024-06-19 LAB — ECHOCARDIOGRAM COMPLETE
Area-P 1/2: 3.39 cm2
S' Lateral: 2.8 cm

## 2024-06-20 ENCOUNTER — Ambulatory Visit: Payer: Self-pay | Admitting: Cardiovascular Disease

## 2024-07-01 ENCOUNTER — Ambulatory Visit: Attending: Cardiology | Admitting: Cardiology

## 2024-07-01 ENCOUNTER — Encounter: Payer: Self-pay | Admitting: Cardiology

## 2024-07-01 VITALS — BP 116/76 | HR 71 | Ht 72.0 in | Wt 215.8 lb

## 2024-07-01 DIAGNOSIS — I2510 Atherosclerotic heart disease of native coronary artery without angina pectoris: Secondary | ICD-10-CM | POA: Diagnosis not present

## 2024-07-01 DIAGNOSIS — I480 Paroxysmal atrial fibrillation: Secondary | ICD-10-CM

## 2024-07-01 DIAGNOSIS — D6869 Other thrombophilia: Secondary | ICD-10-CM | POA: Diagnosis not present

## 2024-07-01 NOTE — Patient Instructions (Signed)
 Medication Instructions:  Your physician recommends that you continue on your current medications as directed. Please refer to the Current Medication list given to you today.  *If you need a refill on your cardiac medications before your next appointment, please call your pharmacy*  Testing/Procedures: Cardiac CT Your physician has requested that you have cardiac CT. Cardiac computed tomography (CT) is a painless test that uses an x-ray machine to take clear, detailed pictures of your heart. For further information please visit [email or web address] Please follow instruction sheet as given. You will be called to schedule this test.  Ablation  Your physician has recommended that you have an ablation. Catheter ablation is a medical procedure used to treat some cardiac arrhythmias (irregular heartbeats). During catheter ablation, a long, thin, flexible tube is put into a blood vessel in your groin (upper thigh), or neck. This tube is called an ablation catheter. It is then guided to your heart through the blood vessel. Radio frequency waves destroy small areas of heart tissue where abnormal heartbeats may cause an arrhythmia to start.   Watchman  Your physician has requested that you have Left atrial appendage (LAA) closure device implantation is a procedure to put a small device in the LAA of the heart. The LAA is a small sac in the wall of the heart's left upper chamber. Blood clots can form in this area. The device, Watchman closes the LAA to help prevent a blood clot and stroke.   You will be contacted by Nurse Navigator, Danielle to schedule your pre-procedure visit and procedure date. If you have any questions she can be reached at [phone].   Follow-Up: At [facility], you and your health needs are our priority.  As part of our continuing mission to provide you with exceptional heart care, our providers are all part of one team.  This team includes your primary Cardiologist  (physician) and Advanced Practice Providers or APPs (Physician Assistants and Nurse Practitioners) who all work together to provide you with the care you need, when you need it.

## 2024-07-01 NOTE — Progress Notes (Signed)
 " Electrophysiology Office Note:   Date:  07/04/2024  ID:  Kevin Larsen, DOB 04/17/1956, MRN [id number]  Primary Cardiologist: Darryle ONEIDA Decent, MD Electrophysiologist: Fonda Kitty, MD      History of Present Illness:   Kevin Larsen is a 69 y.o. male with h/o Hodgkin's lymphoma (s/p XRT), Breast ca (remote) s/p mastectomy/chemo, HTN, HLD, CKD (IIIa), PVD (carotid dz, distal R ICA occl), CAD (CABG 2014), VHD (severe AS and severe MAC) who is being seen today for evaluation for Watchman implant and AF ablation at the request of Dr. Decent.  Discussed the use of AI scribe software for clinical note transcription with the patient, who gave verbal consent to proceed.  History of Present Illness Kevin Larsen is a 69 year old male with atrial fibrillation who presents for evaluation and management of his condition. He was referred by Dr. Decent for evaluation of his atrial fibrillation.  He has a history of atrial fibrillation, first diagnosed approximately two and a half to three years ago following an episode that required hospitalization. About a month ago, he experienced another episode characterized by shortness of breath, prompting an emergency room visit after his Apple Watch indicated the need for medical attention. He was treated and stabilized after five hours.  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 While on Eliquis , he does not clot as easily as before, which is a concern due to the nature of his work. He is currently taking Multaq  to maintain normal rhythm and has had only two episodes of atrial fibrillation, the first being two and a half years ago and the second recently.  He maintains an active lifestyle, walking approximately 5.92 miles a day, and engages in activities such as woodworking and gardening. He mentions that he broke his ankle six weeks ago, which has limited  his physical activity, contributing to his current level of exertion intolerance. He uses an Apple Watch to monitor his heart rhythm, especially when he feels symptoms like shortness of breath or exertional fatigue.  His wife has a history of heart issues and ablations, but this information should be excluded from the patient's HPI unless specifically relevant to his family history.   Review of systems complete and found to be negative unless listed in HPI.   EP Information / Studies Reviewed:    EKG is not ordered today. EKG from 05/09/24 reviewed which showed SR, PR and QRS 84ms.      ECG 02/14/22: AF   Echo 05/2024:  1. Left ventricular ejection fraction, by estimation, is 55 to 60%. Left  ventricular ejection fraction by 3D volume is 57 %. The left ventricle has  normal function. The left ventricle has no regional wall motion  abnormalities. There is mild asymmetric  left ventricular hypertrophy of the basal-septal segment. Left ventricular  diastolic parameters are consistent with Grade II diastolic dysfunction  (pseudonormalization). The average left ventricular global longitudinal  strain is -18.5 %. The global  longitudinal strain is normal.   2. Right ventricular systolic function is normal. The right ventricular  size is normal. There is normal pulmonary artery systolic pressure.   3. Left atrial size was mildly dilated.   4. The mitral valve is normal in structure. No evidence of mitral valve  regurgitation. No evidence of mitral stenosis.   5. The aortic valve is tricuspid. There is mild calcification of the  aortic valve. Aortic valve  regurgitation is not visualized. Aortic valve  sclerosis/calcification is present, without any evidence of aortic  stenosis.   6. Aortic dilatation noted. There is mild dilatation of the aortic root,  measuring 42 mm. There is mild dilatation of the aortic root, measuring 43  mm.   7. The inferior vena cava is normal in size with  greater than 50%  respiratory variability, suggesting right atrial pressure of 3 mmHg.   LHC 11/2021: Severe 2-three-vessel disease: Diffuse RCA disease: 50% heavily calcified proximal followed by tandem 80, 70 then 99% stenoses in the mid segment, then 65-70% distal (positive by FFRCT) Successful extensive PCI of proximal to distal RCA with 3 overlapping DES stents: 3.5 mm x 38 mm, 3.5 mm 32 mm, 3.5 mm x 24 mm-the entire segment was postdilated to 3.8 mm.   All lesions covered-and reduced to 0%.   TIMI-3 flow pre and post Large caliber LAD with bifurcation 60% LAD with 95% ostial D1 (followed by 50%) with tandem 50+ percent stenoses in the mid and distal vessel.   RFR/FFR including proximal 60% and mid 50% lesions was non--obstructive when discounting the more distal lesions (0.95, 0.83).   However RFR was positive when accounting for the second distal lesion 0.86 RFR.  Relatively normal nondominant LCx that courses of the large marginal with 3 branches.-This is consistent with findings on Coronary CTA FFRCT. => Plan for medical management of diagonal disease If he were to have symptoms, could consider bifurcation PCI of the LAD diagonal Moderately elevated LVEDP of 20 mmHg.  Risk Assessment/Calculations:    CHA2DS2-VASc Score = 3   This indicates a 3.2% annual risk of stroke. The patient's score is based upon: CHF History: 0 HTN History: 1 Diabetes History: 0 Stroke History: 0 Vascular Disease History: 1 Age Score: 1 Gender Score: 0      Physical Exam:   VS:  BP 116/76 (BP Location: Left Arm, Patient Position: Sitting, Cuff Size: Large)   Pulse 71   Ht 6' (1.829 m)   Wt 215 lb 12.8 oz (97.9 kg)   SpO2 94%   BMI 29.27 kg/m    Wt Readings from Last 3 Encounters:  07/01/24 215 lb 12.8 oz (97.9 kg)  05/09/24 217 lb (98.4 kg)  05/08/24 206 lb (93.4 kg)     General: Well developed, in no acute distress.  Neck: No JVD.  Cardiac: Normal rate, regular rhythm.  Resp: Normal  work of breathing.  Ext: No edema.  Neuro: No gross focal deficits.  Psych: Normal affect.   ASSESSMENT AND PLAN:    I have seen Trino Swire in the office today who is being considered for a Watchman left atrial appendage closure device. I believe they will benefit from this procedure given their history of atrial fibrillation, CHA2DS2-VASc score of 3 and unadjusted ischemic stroke rate of 3.2% per year. Unfortunately, the patient is not felt to be a long term anticoagulation candidate secondary to bleeding risk, lifestyle, patient preference. The patient's chart has been reviewed and I feel that they would be a candidate for short term oral anticoagulation after Watchman implant.   It is my belief that after undergoing a LAA closure procedure, Otoniel Ma will not need long term anticoagulation which eliminates anticoagulation side effects and major bleeding risk.   Procedural risks for the Watchman implant have been reviewed with the patient including a 0.5% risk of stroke, <1% risk of perforation and <1% risk of device embolization. Other risks include bleeding, vascular damage, tamponade,  worsening renal function, and death. The patient understands these risk and wishes to proceed.     The published clinical data on the safety and effectiveness of WATCHMAN include but are not limited to the following: - Holmes DR, Jess BEARD, Sick P et al. for the PROTECT AF Investigators. Percutaneous closure of the left atrial appendage versus warfarin therapy for prevention of stroke in patients with atrial fibrillation: a randomised non-inferiority trial. Lancet 2009; 374: 534-42. GLENWOOD Jess BEARD, Doshi SK, Jonita VEAR Satchel D et al. on behalf of the PROTECT AF Investigators. Percutaneous Left Atrial Appendage Closure for Stroke Prophylaxis in Patients With Atrial Fibrillation 2.3-Year Follow-up of the PROTECT AF (Watchman Left Atrial Appendage System for Embolic Protection in Patients With Atrial Fibrillation)  Trial. Circulation 2013; 127:720-729. - Alli O, Doshi S,  Kar S, Reddy VY, Sievert H et al. Quality of Life Assessment in the Randomized PROTECT AF (Percutaneous Closure of the Left Atrial Appendage Versus Warfarin Therapy for Prevention of Stroke in Patients With Atrial Fibrillation) Trial of Patients at Risk for Stroke With Nonvalvular Atrial Fibrillation. J Am Coll Cardiol 2013; 61:1790-8. GLENWOOD Satchel DR, Archer RAMAN, Price M, Whisenant B, Sievert H, Doshi S, Huber K, Reddy V. Prospective randomized evaluation of the Watchman left atrial appendage Device in patients with atrial fibrillation versus long-term warfarin therapy; the PREVAIL trial. Journal of the [employer] of Cardiology, Vol. 4, No. 1, 2014, 1-11. - Kar S, Doshi SK, Sadhu A, Horton R, Osorio J et al. Primary outcome evaluation of a next-generation left atrial appendage closure device: results from the PINNACLE FLX trial. Circulation 2021;143(18)1754-1762.    HAS-BLED score 3 Hypertension Yes  Abnormal renal and liver function (Dialysis, transplant, Cr >2.26 mg/dL /Cirrhosis or Bilirubin >2x Normal or AST/ALT/AP >3x Normal) No  Stroke No  Bleeding Yes  Labile INR (Unstable/high INR) No  Elderly (>65) Yes  Drugs or alcohol (>= 8 drinks/week, anti-plt or NSAID) No   CHA2DS2-VASc Score = 3  The patient's score is based upon: CHF History: 0 HTN History: 1 Diabetes History: 0 Stroke History: 0 Vascular Disease History: 1 Age Score: 1 Gender Score: 0      #Paroxysmal atrial fibrillation: Symptomatic. -Discussed treatment options today for AF including antiarrhythmic drug therapy and ablation. Discussed risks, recovery and likelihood of success with each treatment strategy. Risk, benefits, and alternatives to EP study and ablation for afib were discussed. These risks include but are not limited to stroke, bleeding, vascular damage, tamponade, perforation, damage to the esophagus, lungs, phrenic nerve and other structures, pulmonary  vein stenosis, worsening renal function, coronary vasospasm and death.  Discussed potential need for repeat ablation procedures and antiarrhythmic drugs after an initial ablation. The patient understands these risk and wishes to proceed.  We will therefore proceed with catheter ablation at the next available time.  Carto, ICE, anesthesia are requested for the procedure.   - Continue dronedarone  400 mg twice daily.  Metoprolol  as needed for sustained tachycardia.  #Hypercoagulable state due to AF:  - Patient is very concerned about long-term Eliquis  use given his history of bleeding and lifestyle as woodworker.  Patient is interested in Watchman device as an alternative to oral anticoagulation.  Discussed that if we are going to pursue catheter ablation for atrial fibrillation, then Watchman could be implanted concomitantly.  Risk and benefits of this were discussed.  Patient voiced understanding and elected to proceed.  He will continue Eliquis  5 mg twice daily in the interim.   #Atherosclerotic heart disease  of native coronary artery, status post PCI, without angina Post-PCI to RCA, asymptomatic. LDL well-controlled. - Reduced atorvastatin  to 40 mg daily.   #Aneurysm of ascending aorta without rupture - Yearly follow-up imaging with general cardiology.     Follow up with Dr. Kennyth 3 months after ablation.   Signed, Fonda Kennyth, MD  "

## 2024-07-07 ENCOUNTER — Other Ambulatory Visit: Payer: Self-pay

## 2024-07-07 ENCOUNTER — Telehealth: Payer: Self-pay

## 2024-07-07 DIAGNOSIS — I480 Paroxysmal atrial fibrillation: Secondary | ICD-10-CM

## 2024-07-07 NOTE — Telephone Encounter (Signed)
 Kevin Larsen

## 2024-07-07 NOTE — Telephone Encounter (Addendum)
 Patient called in to arrange concomitant ablation/watchman. Per Dr. Kennyth, he would like repeat CT scan prior due to concomitant case.   Spoke with patient. Arranged CT scan 07/21/24. Will send instruction letter via MyChart.  Patient will go for labs to [facility] prior.    Confirmed concomitant ablation/watchman date of 08/04/24 with Dr. Kennyth. Will send instructions via MyChart. Patient aware to contact RN Navigator if he develops any symptoms or illness prior to scheduled procedure.  Patient agreeable and grateful for the call.

## 2024-07-18 ENCOUNTER — Ambulatory Visit (HOSPITAL_COMMUNITY)
Admission: RE | Admit: 2024-07-18 | Discharge: 2024-07-18 | Disposition: A | Source: Ambulatory Visit | Attending: Cardiology

## 2024-07-18 DIAGNOSIS — I480 Paroxysmal atrial fibrillation: Secondary | ICD-10-CM | POA: Diagnosis present

## 2024-07-18 LAB — CBC WITH DIFFERENTIAL/PLATELET

## 2024-07-18 MED ORDER — IOHEXOL 350 MG/ML SOLN
80.0000 mL | Freq: Once | INTRAVENOUS | Status: AC | PRN
  Administered 2024-07-18: 80 mL via INTRAVENOUS

## 2024-07-19 LAB — CBC WITH DIFFERENTIAL/PLATELET
Basophils Absolute: 0.1 10*3/uL (ref 0.0–0.2)
Basos: 1 %
EOS (ABSOLUTE): 0.1 10*3/uL (ref 0.0–0.4)
Eos: 2 %
Hematocrit: 50.3 % (ref 37.5–51.0)
Hemoglobin: 16.8 g/dL (ref 13.0–17.7)
Immature Grans (Abs): 0 10*3/uL (ref 0.0–0.1)
Immature Granulocytes: 0 %
Lymphocytes Absolute: 1 10*3/uL (ref 0.7–3.1)
Lymphs: 20 %
MCH: 31.1 pg (ref 26.6–33.0)
MCHC: 33.4 g/dL (ref 31.5–35.7)
MCV: 93 fL (ref 79–97)
Monocytes Absolute: 0.5 10*3/uL (ref 0.1–0.9)
Monocytes: 11 %
Neutrophils Absolute: 3.3 10*3/uL (ref 1.4–7.0)
Neutrophils: 66 %
Platelets: 237 10*3/uL (ref 150–450)
RBC: 5.4 x10E6/uL (ref 4.14–5.80)
RDW: 11.7 % (ref 11.6–15.4)
WBC: 5.1 10*3/uL (ref 3.4–10.8)

## 2024-07-19 LAB — BASIC METABOLIC PANEL WITH GFR
BUN/Creatinine Ratio: 14 (ref 10–24)
BUN: 16 mg/dL (ref 8–27)
CO2: 22 mmol/L (ref 20–29)
Calcium: 9.3 mg/dL (ref 8.6–10.2)
Chloride: 104 mmol/L (ref 96–106)
Creatinine, Ser: 1.14 mg/dL (ref 0.76–1.27)
Glucose: 103 mg/dL — ABNORMAL HIGH (ref 70–99)
Potassium: 4.4 mmol/L (ref 3.5–5.2)
Sodium: 140 mmol/L (ref 134–144)
eGFR: 70 mL/min/{1.73_m2}

## 2024-07-20 ENCOUNTER — Ambulatory Visit: Payer: Self-pay | Admitting: Cardiology

## 2024-07-21 ENCOUNTER — Ambulatory Visit (HOSPITAL_COMMUNITY)

## 2024-07-21 ENCOUNTER — Telehealth: Payer: Self-pay

## 2024-07-21 NOTE — Telephone Encounter (Signed)
 Small appendage Max 19/ AVG 16.5/ Depth 14 Likely need a 20mm device Very low/Mid-ant TSP RAO 20 CAU 20

## 2024-07-23 NOTE — Telephone Encounter (Signed)
 Spoke with patient. Explained Dr. Kennyth reviewed CT scan and good to proceed as scheduled. Reviewed updated procedural time of 12:30 PM with arrival time of 10 AM. Patient verbalized understanding.   Patient grateful for assistance.

## 2024-07-28 ENCOUNTER — Telehealth: Payer: Self-pay

## 2024-07-28 NOTE — Telephone Encounter (Signed)
 Left message for patient to return call to review procedural instructions for PVI/LAAO 08/04/24. Report time 1000 for 1230 procedure with Dr. Kennyth.

## 2024-08-04 ENCOUNTER — Inpatient Hospital Stay (HOSPITAL_COMMUNITY): Admission: RE | Admit: 2024-08-04 | Source: Home / Self Care | Admitting: Cardiology

## 2024-08-04 ENCOUNTER — Encounter (HOSPITAL_COMMUNITY): Admission: RE | Payer: Self-pay | Source: Home / Self Care

## 2024-09-04 ENCOUNTER — Ambulatory Visit (HOSPITAL_COMMUNITY): Admitting: Physician Assistant
